# Patient Record
Sex: Male | Born: 1950 | Race: White | Hispanic: No | Marital: Married | Smoking: Never smoker
Health system: Southern US, Community
[De-identification: ages and names within clinical notes are randomized; demographics above are authoritative.]

## PROBLEM LIST (undated history)

## (undated) DIAGNOSIS — R519 Headache, unspecified: Secondary | ICD-10-CM

## (undated) DIAGNOSIS — R51 Headache: Secondary | ICD-10-CM

## (undated) DIAGNOSIS — G44099 Other trigeminal autonomic cephalgias (TAC), not intractable: Secondary | ICD-10-CM

## (undated) DIAGNOSIS — E785 Hyperlipidemia, unspecified: Secondary | ICD-10-CM

## (undated) DIAGNOSIS — K589 Irritable bowel syndrome without diarrhea: Secondary | ICD-10-CM

## (undated) HISTORY — PX: BACK SURGERY: SHX140

## (undated) HISTORY — PX: KNEE ARTHROCENTESIS: SUR44

## (undated) NOTE — Progress Notes (Signed)
 Formatting of this note might be different from the original. Subjective  Patient ID: Matthew Sheppard is a 51 y.o. male presenting to the Urgent Care with a chief complaint of Rash (Skin redness- burning/itchy to both hand x5days ).  Skin on the backs of both hands has been burning and itching for the past 5 or 6 days, especially when he wakes up in the morning and after washing his hands. The area is red and dry looking. No swelling, drainage, joint pain, fever, chills, nausea. 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 He denies any known exposure to poisonous plants or chemicals. He has started using a moisturizing cream in the last 2 days which seems to be helping. No new medications or medication changes.  Rash  Objective  BP 149/81 (BP Location: Left arm, Patient Position: Sitting, BP Cuff Size: Adult)   Pulse 92   Temp 36.4 C (97.6 F) (Temporal)   Resp 18   Ht 1.753 m (5' 9)   Wt 74.8 kg (165 lb)   SpO2 98%   BMI 24.37 kg/m   Physical Exam Vitals and nursing note reviewed.  Constitutional:      General: He is not in acute distress. Eyes:     Extraocular Movements: Extraocular movements intact.     Conjunctiva/sclera: Conjunctivae normal.  Cardiovascular:     Rate and Rhythm: Normal rate.  Pulmonary:     Effort: Pulmonary effort is normal.  Musculoskeletal:        General: Normal range of motion.     Right hand: No swelling, deformity, tenderness or bony tenderness. Normal range of motion. Normal strength. Normal sensation. Normal capillary refill. Normal pulse.     Left hand: No swelling, deformity, tenderness or bony tenderness. Normal range of motion. Normal strength. Normal sensation. Normal capillary refill. Normal pulse.  Skin:    Findings: Erythema (mild redness to the backs of both hands; no edema or induration, no vesicles or scale) present. No ecchymosis, rash or wound.  Neurological:     Mental Status: He is alert.     Sensory:  Sensation is intact.     Motor: Motor function is intact.     Coordination: Coordination is intact.     Gait: Gait is intact.     Assessment & Plan   Assessment & Plan Irritant dermatitis    Other orders   triamcinolone (Kenalog) 0.5 % ointment; Apply 1 Application topically in the morning and 1 Application before bedtime. Do all this for 7 days.  In-House Lab Results:  No results found for this or any previous visit.   In-House Imaging Reads:    Procedure Documentation: Procedures   ED Course & MDM  MDM - Medical Decision Making: Differential diagnosis considered include: dermatitis, irritant or contact; dry skin  Electronically signed by Lonni DELENA Potters, MD at 08/16/2024  5:47 PM EDT

---

## 2007-05-21 DIAGNOSIS — L570 Actinic keratosis: Secondary | ICD-10-CM

## 2007-05-21 HISTORY — DX: Actinic keratosis: L57.0

## 2010-09-12 ENCOUNTER — Ambulatory Visit: Payer: Self-pay | Admitting: Family Medicine

## 2011-07-11 ENCOUNTER — Ambulatory Visit: Payer: Self-pay | Admitting: Family Medicine

## 2012-04-20 ENCOUNTER — Ambulatory Visit: Payer: Self-pay | Admitting: Family Medicine

## 2015-06-28 ENCOUNTER — Ambulatory Visit
Admission: RE | Admit: 2015-06-28 | Discharge: 2015-06-28 | Disposition: A | Discharge status: Home / Self Care | Payer: BLUE CROSS/BLUE SHIELD | Source: Ambulatory Visit | Attending: Gastroenterology | Admitting: Gastroenterology

## 2015-06-28 ENCOUNTER — Encounter
Admission: RE | Disposition: A | Discharge status: Home / Self Care | Payer: Self-pay | Source: Ambulatory Visit | Attending: Gastroenterology

## 2015-06-28 ENCOUNTER — Encounter: Payer: Self-pay | Admitting: *Deleted

## 2015-06-28 ENCOUNTER — Ambulatory Visit: Payer: BLUE CROSS/BLUE SHIELD | Admitting: Anesthesiology

## 2015-06-28 DIAGNOSIS — R51 Headache: Secondary | ICD-10-CM | POA: Diagnosis not present

## 2015-06-28 DIAGNOSIS — Z79899 Other long term (current) drug therapy: Secondary | ICD-10-CM | POA: Insufficient documentation

## 2015-06-28 DIAGNOSIS — E785 Hyperlipidemia, unspecified: Secondary | ICD-10-CM | POA: Insufficient documentation

## 2015-06-28 DIAGNOSIS — K621 Rectal polyp: Secondary | ICD-10-CM | POA: Diagnosis not present

## 2015-06-28 DIAGNOSIS — K589 Irritable bowel syndrome without diarrhea: Secondary | ICD-10-CM | POA: Diagnosis not present

## 2015-06-28 DIAGNOSIS — Z1211 Encounter for screening for malignant neoplasm of colon: Secondary | ICD-10-CM | POA: Insufficient documentation

## 2015-06-28 DIAGNOSIS — Z7982 Long term (current) use of aspirin: Secondary | ICD-10-CM | POA: Insufficient documentation

## 2015-06-28 HISTORY — PX: COLONOSCOPY WITH PROPOFOL: SHX5780

## 2015-06-28 HISTORY — DX: Headache: R51

## 2015-06-28 HISTORY — DX: Headache, unspecified: R51.9

## 2015-06-28 HISTORY — DX: Irritable bowel syndrome, unspecified: K58.9

## 2015-06-28 HISTORY — DX: Hyperlipidemia, unspecified: E78.5

## 2015-06-28 SURGERY — COLONOSCOPY WITH PROPOFOL
Anesthesia: General

## 2015-06-28 MED ORDER — LIDOCAINE HCL (PF) 2 % IJ SOLN
INTRAMUSCULAR | Status: DC | PRN
  Administered 2015-06-28: 20 mg via INTRADERMAL

## 2015-06-28 MED ORDER — PROPOFOL INFUSION 10 MG/ML OPTIME
INTRAVENOUS | Status: DC | PRN
  Administered 2015-06-28: 125 ug/kg/min via INTRAVENOUS

## 2015-06-28 MED ORDER — SODIUM CHLORIDE 0.9 % IV SOLN: INTRAVENOUS | Status: DC

## 2015-06-28 MED ORDER — PROPOFOL 10 MG/ML IV BOLUS
INTRAVENOUS | Status: DC | PRN
  Administered 2015-06-28: 80 mg via INTRAVENOUS

## 2015-06-28 MED ORDER — SODIUM CHLORIDE 0.9 % IV SOLN
INTRAVENOUS | Status: DC
Start: 2015-06-28 — End: 2015-06-28
  Administered 2015-06-28: 1000 mL via INTRAVENOUS

## 2015-06-28 NOTE — H&P (Signed)
Outpatient short stay form Pre-procedure 06/28/2015 9:11 AM Lollie Sails MD  Primary Physician: Dr. Thereasa Distance  Reason for visit:  Colonoscopy  History of present illness:  Patient is a 64 year old male presenting with a personal history of irritable bowel syndrome and for screening colonoscopy. His prep well. He takes no over-the-counter or aspirin products or blood thinners with the exception 81 mg aspirin which he is held for at least several days.    Current facility-administered medications:  .  0.9 %  sodium chloride infusion, , Intravenous, Continuous, Lollie Sails, MD .  0.9 %  sodium chloride infusion, , Intravenous, Continuous, Lollie Sails, MD, Last Rate: 20 mL/hr at 06/28/15 0838, 1,000 mL at 06/28/15 8299  Prescriptions prior to admission  Medication Sig Dispense Refill Last Dose  . aspirin EC 81 MG tablet Take 81 mg by mouth daily.     . carbamazepine (TEGRETOL) 200 MG tablet Take 200 mg by mouth 3 (three) times daily.     Marland Kitchen doxycycline (MONODOX) 50 MG capsule Take 50 mg by mouth 2 (two) times daily.     . hyoscyamine (ANASPAZ) 0.125 MG TBDP disintergrating tablet Place 0.125 mg under the tongue.     . indomethacin (INDOCIN) 50 MG capsule Take 50 mg by mouth 2 (two) times daily with a meal.     . pravastatin (PRAVACHOL) 10 MG tablet Take 10 mg by mouth daily.        No Known Allergies   Past Medical History  Diagnosis Date  . IBS (irritable bowel syndrome)   . Headache   . Hyperlipemia     Review of systems:      Physical Exam    Heart and lungs: Regular rate and rhythm without rub or gallop, lungs are bilaterally clear    HEENT: Normocephalic atraumatic eyes are anicteric    Other:     Pertinant exam for procedure: Soft nontender nondistended bowel sounds positive normoactive    Planned proceedures: Colonoscopy and indicated procedures I have discussed the risks benefits and complications of procedures to include not limited to  bleeding, infection, perforation and the risk of sedation and the patient wishes to proceed.    Lollie Sails, MD Gastroenterology 06/28/2015  9:11 AM

## 2015-06-28 NOTE — Anesthesia Preprocedure Evaluation (Signed)
Anesthesia Evaluation  Patient identified by MRN, date of birth, ID band Patient awake    Reviewed: Allergy & Precautions, H&P , NPO status , Patient's Chart, lab work & pertinent test results, reviewed documented beta blocker date and time   History of Anesthesia Complications Negative for: history of anesthetic complications  Airway Mallampati: II  TM Distance: >3 FB Neck ROM: full    Dental no notable dental hx. (+) Teeth Intact   Pulmonary neg pulmonary ROS,  breath sounds clear to auscultation  Pulmonary exam normal       Cardiovascular Exercise Tolerance: Good negative cardio ROS Normal cardiovascular examRhythm:regular Rate:Normal     Neuro/Psych  Headaches, negative psych ROS   GI/Hepatic negative GI ROS, Neg liver ROS,   Endo/Other  negative endocrine ROS  Renal/GU negative Renal ROS  negative genitourinary   Musculoskeletal   Abdominal   Peds  Hematology negative hematology ROS (+)   Anesthesia Other Findings Past Medical History:   IBS (irritable bowel syndrome)                               Headache                                                     Hyperlipemia                                                 Reproductive/Obstetrics negative OB ROS                             Anesthesia Physical Anesthesia Plan  ASA: II  Anesthesia Plan: General   Post-op Pain Management:    Induction:   Airway Management Planned:   Additional Equipment:   Intra-op Plan:   Post-operative Plan:   Informed Consent: I have reviewed the patients History and Physical, chart, labs and discussed the procedure including the risks, benefits and alternatives for the proposed anesthesia with the patient or authorized representative who has indicated his/her understanding and acceptance.   Dental Advisory Given  Plan Discussed with: Anesthesiologist, CRNA and Surgeon  Anesthesia Plan  Comments:         Anesthesia Quick Evaluation

## 2015-06-28 NOTE — Transfer of Care (Signed)
Immediate Anesthesia Transfer of Care Note  Patient: Matthew Sheppard  Procedure(s) Performed: Procedure(s): COLONOSCOPY WITH PROPOFOL (N/A)  Patient Location: Endoscopy Unit  Anesthesia Type:General  Level of Consciousness: awake, alert  and oriented  Airway & Oxygen Therapy: Patient Spontanous Breathing and Patient connected to nasal cannula oxygen  Post-op Assessment: Report given to RN and Post -op Vital signs reviewed and stable  Post vital signs: Reviewed and stable  Last Vitals:  Filed Vitals:   06/28/15 0819  BP: 126/82  Pulse: 86  Temp: 36.7 C  Resp: 18    Complications: No apparent anesthesia complications

## 2015-06-29 ENCOUNTER — Encounter: Payer: Self-pay | Admitting: Gastroenterology

## 2015-06-30 LAB — SURGICAL PATHOLOGY

## 2015-06-30 NOTE — Anesthesia Postprocedure Evaluation (Signed)
  Anesthesia Post-op Note  Patient: Matthew Sheppard  Procedure(s) Performed: Procedure(s): COLONOSCOPY WITH PROPOFOL (N/A)  Anesthesia type:General  Patient location: PACU  Post pain: Pain level controlled  Post assessment: Post-op Vital signs reviewed, Patient's Cardiovascular Status Stable, Respiratory Function Stable, Patent Airway and No signs of Nausea or vomiting  Post vital signs: Reviewed and stable  Last Vitals:  Filed Vitals:   06/28/15 1020  BP: 103/79  Pulse: 65  Temp:   Resp: 12    Level of consciousness: awake, alert  and patient cooperative  Complications: No apparent anesthesia complications

## 2015-06-30 NOTE — Op Note (Signed)
[facility] Gastroenterology Patient Name: Matthew Sheppard Procedure Date: 06/28/2015 9:15 AM MRN: [id number] Account #: [id number] Date of Birth: April 11, 1951 Admit Type: Outpatient Age: 63 Room: [facility] ENDO ROOM 3 Gender: Male Note Status: Finalized Procedure:         Colonoscopy Indications:       Screening for colorectal malignant neoplasm Providers:         Lollie Sails, MD Referring MD:      Sofie Hartigan (Referring MD) Medicines:         Monitored Anesthesia Care Complications:     No immediate complications. Procedure:         Pre-Anesthesia Assessment:                    - ASA Grade Assessment: II - A patient with mild systemic                     disease.                    After obtaining informed consent, the colonoscope was                     passed under direct vision. Throughout the procedure, the                     patient's blood pressure, pulse, and oxygen saturations                     were monitored continuously. The Colonoscope was                     introduced through the anus and advanced to the the cecum,                     identified by appendiceal orifice and ileocecal valve. The                     colonoscopy was performed without difficulty. The patient                     tolerated the procedure well. The quality of the bowel                     preparation was good except the ascending colon was fair. Findings:      A 4 mm polyp was found in the rectum. The polyp was sessile. The polyp       was removed with a cold snare. Resection and retrieval were complete.      The exam was otherwise without abnormality.      The retroflexed view of the distal rectum and anal verge was normal and       showed no anal or rectal abnormalities. Impression:        - One 4 mm polyp in the rectum. Resected and retrieved. Recommendation:    - Await pathology results.                    - Telephone GI clinic for pathology results in 1  week. Procedure Code(s): --- Professional ---                    [phone], Colonoscopy, flexible; with removal of tumor(s),  polyp(s), or other lesion(s) by snare technique Diagnosis Code(s): --- Professional ---                    V76.51, Special screening for malignant neoplasms of colon                    569.0, Anal and rectal polyp CPT copyright 2014 American Medical Association. All rights reserved. The codes documented in this report are preliminary and upon coder review may  be revised to meet current compliance requirements. Lollie Sails, MD 06/28/2015 9:47:20 AM This report has been signed electronically. Number of Addenda: 0 Note Initiated On: 06/28/2015 9:15 AM Scope Withdrawal Time: 0 hours 10 minutes 26 seconds  Total Procedure Duration: 0 hours 19 minutes 42 seconds       [facility]

## 2016-03-22 ENCOUNTER — Other Ambulatory Visit: Payer: Self-pay | Admitting: Gastroenterology

## 2016-03-22 DIAGNOSIS — R1031 Right lower quadrant pain: Secondary | ICD-10-CM

## 2016-03-22 DIAGNOSIS — R1032 Left lower quadrant pain: Secondary | ICD-10-CM

## 2016-03-27 ENCOUNTER — Ambulatory Visit
Admission: RE | Admit: 2016-03-27 | Discharge: 2016-03-27 | Disposition: A | Discharge status: Home / Self Care | Payer: BLUE CROSS/BLUE SHIELD | Source: Ambulatory Visit | Attending: Gastroenterology | Admitting: Gastroenterology

## 2016-03-27 DIAGNOSIS — R1031 Right lower quadrant pain: Secondary | ICD-10-CM | POA: Insufficient documentation

## 2016-03-27 DIAGNOSIS — R911 Solitary pulmonary nodule: Secondary | ICD-10-CM | POA: Diagnosis not present

## 2016-03-27 DIAGNOSIS — R1032 Left lower quadrant pain: Secondary | ICD-10-CM | POA: Insufficient documentation

## 2016-03-27 MED ORDER — IOPAMIDOL (ISOVUE-300) INJECTION 61%
100.0000 mL | Freq: Once | INTRAVENOUS | Status: AC | PRN
  Administered 2016-03-27: 100 mL via INTRAVENOUS

## 2017-05-23 ENCOUNTER — Other Ambulatory Visit: Payer: Self-pay | Admitting: Family Medicine

## 2017-05-23 DIAGNOSIS — R911 Solitary pulmonary nodule: Secondary | ICD-10-CM

## 2017-07-18 ENCOUNTER — Ambulatory Visit
Admission: RE | Admit: 2017-07-18 | Discharge: 2017-07-18 | Disposition: A | Discharge status: Home / Self Care | Payer: BLUE CROSS/BLUE SHIELD | Source: Ambulatory Visit | Attending: Family Medicine | Admitting: Family Medicine

## 2017-07-18 DIAGNOSIS — R911 Solitary pulmonary nodule: Secondary | ICD-10-CM

## 2020-02-02 ENCOUNTER — Encounter: Payer: BLUE CROSS/BLUE SHIELD | Admitting: Dermatology

## 2020-06-22 ENCOUNTER — Other Ambulatory Visit: Payer: Self-pay

## 2020-06-22 ENCOUNTER — Ambulatory Visit: Payer: BC Managed Care – PPO | Admitting: Dermatology

## 2020-06-22 DIAGNOSIS — D229 Melanocytic nevi, unspecified: Secondary | ICD-10-CM

## 2020-06-22 DIAGNOSIS — L814 Other melanin hyperpigmentation: Secondary | ICD-10-CM

## 2020-06-22 DIAGNOSIS — L820 Inflamed seborrheic keratosis: Secondary | ICD-10-CM | POA: Diagnosis not present

## 2020-06-22 DIAGNOSIS — L821 Other seborrheic keratosis: Secondary | ICD-10-CM

## 2020-06-22 DIAGNOSIS — Z1283 Encounter for screening for malignant neoplasm of skin: Secondary | ICD-10-CM

## 2020-06-22 DIAGNOSIS — L720 Epidermal cyst: Secondary | ICD-10-CM | POA: Diagnosis not present

## 2020-06-22 DIAGNOSIS — L719 Rosacea, unspecified: Secondary | ICD-10-CM | POA: Diagnosis not present

## 2020-06-22 DIAGNOSIS — D1800 Hemangioma unspecified site: Secondary | ICD-10-CM

## 2020-06-22 DIAGNOSIS — L570 Actinic keratosis: Secondary | ICD-10-CM | POA: Diagnosis not present

## 2020-06-22 DIAGNOSIS — L578 Other skin changes due to chronic exposure to nonionizing radiation: Secondary | ICD-10-CM

## 2020-06-22 MED ORDER — DOXYCYCLINE MONOHYDRATE 50 MG PO CAPS: 50.0000 mg | ORAL_CAPSULE | Freq: Every day | ORAL | 6 refills | Status: DC

## 2020-06-22 NOTE — Progress Notes (Signed)
   Follow-Up Visit   Subjective  Matthew Sheppard is a 69 y.o. male who presents for the following: Annual Exam (History of AK - UBSE today). The patient presents for Upper Body Skin Exam (UBSE) for skin cancer screening and mole check.  The following portions of the chart were reviewed this encounter and updated as appropriate:  Allergies  Meds  Problems  Med Hx  Surg Hx  Fam Hx     Review of Systems:  No other skin or systemic complaints except as noted in HPI or Assessment and Plan.  Objective  Well appearing patient in no apparent distress; mood and affect are within normal limits.  All skin waist up examined.  Objective  Left mid back, Neck - Posterior: 1.1 cm cystic papule of left mid back  1.0 cm cystic papule of left post neck  Objective  Face: Dilated blood vessels and thickening of skin of nose   Objective  Scalp/left ear (3): Erythematous thin papules/macules with gritty scale.   Objective  Right lat forehead: Erythematous keratotic or waxy stuck-on papule or plaque.    Assessment & Plan    Lentigines - Scattered tan macules - Discussed due to sun exposure - Benign, observe - Call for any changes  Seborrheic Keratoses - Stuck-on, waxy, tan-brown papules and plaques  - Discussed benign etiology and prognosis. - Observe - Call for any changes  Melanocytic Nevi - Tan-brown and/or pink-flesh-colored symmetric macules and papules - Benign appearing on exam today - Observation - Call clinic for new or changing moles - Recommend daily use of broad spectrum spf 30+ sunscreen to sun-exposed areas.   Hemangiomas - Red papules - Discussed benign nature - Observe - Call for any changes  Actinic Damage - diffuse scaly erythematous macules with underlying dyspigmentation - Recommend daily broad spectrum sunscreen SPF 30+ to sun-exposed areas, reapply every 2 hours as needed.  - Call for new or changing lesions.  Skin cancer screening performed  today.  Epidermal inclusion cyst (2) Neck - Posterior; Left mid back  Discussed excision. Patient may consider in the future  Rosacea Face  Rosacea with rhinophyma  doxycycline (MONODOX) 50 MG capsule - Face  AK (actinic keratosis) (3) Scalp/left ear  Destruction of lesion - Scalp/left ear Complexity: simple   Destruction method: cryotherapy   Informed consent: discussed and consent obtained   Timeout:  patient name, date of birth, surgical site, and procedure verified Lesion destroyed using liquid nitrogen: Yes   Region frozen until ice ball extended beyond lesion: Yes   Outcome: patient tolerated procedure well with no complications   Post-procedure details: wound care instructions given    Inflamed seborrheic keratosis Right lat forehead  Destruction of lesion - Right lat forehead Complexity: simple   Destruction method: cryotherapy   Informed consent: discussed and consent obtained   Timeout:  patient name, date of birth, surgical site, and procedure verified Lesion destroyed using liquid nitrogen: Yes   Region frozen until ice ball extended beyond lesion: Yes   Outcome: patient tolerated procedure well with no complications   Post-procedure details: wound care instructions given    Skin cancer screening  Return in about 6 months (around 12/23/2020).   I, Ashok Cordia, CMA, am acting as scribe for Sarina Ser, MD .  Documentation: I have reviewed the above documentation for accuracy and completeness, and I agree with the above.  Sarina Ser, MD

## 2020-06-23 ENCOUNTER — Encounter: Payer: No Typology Code available for payment source | Admitting: Dermatology

## 2020-06-28 ENCOUNTER — Encounter: Payer: Self-pay | Admitting: Dermatology

## 2020-07-13 ENCOUNTER — Other Ambulatory Visit: Payer: Self-pay | Admitting: Dermatology

## 2020-08-16 ENCOUNTER — Ambulatory Visit
Admission: EM | Admit: 2020-08-16 | Discharge: 2020-08-16 | Disposition: A | Discharge status: Home / Self Care | Payer: BC Managed Care – PPO

## 2020-08-16 ENCOUNTER — Other Ambulatory Visit: Payer: Self-pay

## 2020-08-16 ENCOUNTER — Encounter: Payer: Self-pay | Admitting: Emergency Medicine

## 2020-08-16 ENCOUNTER — Ambulatory Visit (INDEPENDENT_AMBULATORY_CARE_PROVIDER_SITE_OTHER): Payer: BC Managed Care – PPO

## 2020-08-16 DIAGNOSIS — S6710XA Crushing injury of unspecified finger(s), initial encounter: Secondary | ICD-10-CM

## 2020-08-16 DIAGNOSIS — L539 Erythematous condition, unspecified: Secondary | ICD-10-CM

## 2020-08-16 DIAGNOSIS — M7989 Other specified soft tissue disorders: Secondary | ICD-10-CM

## 2020-08-16 DIAGNOSIS — S6010XA Contusion of unspecified finger with damage to nail, initial encounter: Secondary | ICD-10-CM

## 2020-08-16 DIAGNOSIS — M79645 Pain in left finger(s): Secondary | ICD-10-CM

## 2020-08-16 NOTE — ED Triage Notes (Signed)
Pt c/o left ring finger pain, swelling and bruising. He states he using a crow bar and it slipped and hit his finger at the nail. Injury occurred about 3 days ago.

## 2020-08-16 NOTE — ED Provider Notes (Signed)
[facility]    CSN: [id number] Arrival date & time: 08/16/20  1209      History   Chief Complaint Chief Complaint  Patient presents with   Finger Injury    left ring finger    HPI Matthew Sheppard is a 69 y.o. male   presenting for injury of the left ring finger 3 days ago.  He says that he has bruising under the nail and pain at the tip of the finger.  Patient states he initially injured this finger a week and half ago when he hit with a wrench.  He says it seemed fine until he reinjured the same finger 3 days ago when he was using a wood splitter.  He denies any cuts admits to swelling, bruising and pain.  He has not been able to relieve the pain.  He denies any associated fevers.  There is no bleeding or drainage.  He does have pain moving the finger especially moving the tip of the finger.  Never had any problems with his finger before.  No other complaints or concerns.  HPI  Past Medical History:  Diagnosis Date   Actinic keratosis 05/21/2007   Right cheek.    Actinic keratosis 06/24/2014   Mid to distal dorsum nose.    Headache    Hyperlipemia    IBS (irritable bowel syndrome)     There are no problems to display for this patient.   Past Surgical History:  Procedure Laterality Date   BACK SURGERY     COLONOSCOPY WITH PROPOFOL N/A 06/28/2015   Procedure: COLONOSCOPY WITH PROPOFOL;  Surgeon: Lollie Sails, MD;  Location: [facility] ENDOSCOPY;  Service: Endoscopy;  Laterality: N/A;   KNEE ARTHROCENTESIS         Home Medications    Prior to Admission medications   Medication Sig Start Date End Date Taking? Authorizing Provider  aspirin EC 81 MG tablet Take 81 mg by mouth daily.   Yes [provider]  carbamazepine (TEGRETOL) 200 MG tablet Take 200 mg by mouth 3 (three) times daily.   Yes [provider]  gabapentin (NEURONTIN) 800 MG tablet TAKE 1 TABLET(800 MG) BY MOUTH THREE TIMES DAILY 06/16/20  Yes [provider]   indomethacin (INDOCIN) 50 MG capsule Take 50 mg by mouth 2 (two) times daily with a meal.   Yes [provider]  pravastatin (PRAVACHOL) 10 MG tablet Take 10 mg by mouth daily.   Yes [provider]  doxycycline (ADOXA) 50 MG tablet TAKE 1 TABLET BY MOUTH ONCE A DAY WITH FOOD AND PLENTY OF FLUID 07/13/20   Ralene Bathe, MD  doxycycline (MONODOX) 50 MG capsule Take 1 capsule (50 mg total) by mouth daily. 06/22/20   Ralene Bathe, MD  hyoscyamine (ANASPAZ) 0.125 MG TBDP disintergrating tablet Place 0.125 mg under the tongue.    [provider]    Family History History reviewed. No pertinent family history.  Social History Social History   Tobacco Use   Smoking status: Never Smoker   Smokeless tobacco: Never Used  Scientific laboratory technician Use: Never used  Substance Use Topics   Alcohol use: Not Currently   Drug use: Not Currently     Allergies   Patient has no known allergies.   Review of Systems Review of Systems  Constitutional: Negative for fatigue.  Musculoskeletal: Positive for arthralgias and joint swelling.  Skin: Positive for color change. Negative for rash and wound.  Neurological: Negative  for weakness and numbness.  Hematological: Does not bruise/bleed easily.     Physical Exam Triage Vital Signs ED Triage Vitals  Enc Vitals Group     BP 08/16/20 1259 138/85     Pulse Rate 08/16/20 1259 92     Resp 08/16/20 1259 18     Temp 08/16/20 1259 98.4 F (36.9 C)     Temp Source 08/16/20 1259 Oral     SpO2 08/16/20 1259 97 %     Weight 08/16/20 1255 162 lb 0.6 oz (73.5 kg)     Height 08/16/20 1255 5\' 9"  (1.753 m)     Head Circumference --      Peak Flow --      Pain Score 08/16/20 1255 4     Pain Loc --      Pain Edu? --      Excl. in [place]? --    No data found.  Updated Vital Signs BP 138/85 (BP Location: Left Arm)    Pulse 92    Temp 98.4 F (36.9 C) (Oral)    Resp 18    Ht 5\' 9"  (1.753 m)    Wt 162 lb 0.6 oz (73.5 kg)     SpO2 97%    BMI 23.93 kg/m       Physical Exam Vitals and nursing note reviewed.  Constitutional:      General: He is not in acute distress.    Appearance: Normal appearance. He is well-developed and normal weight. He is not toxic-appearing.  HENT:     Head: Normocephalic and atraumatic.  Eyes:     General: No scleral icterus.    Conjunctiva/sclera: Conjunctivae normal.  Cardiovascular:     Rate and Rhythm: Normal rate and regular rhythm.     Pulses: Normal pulses.  Pulmonary:     Effort: Pulmonary effort is normal. No respiratory distress.  Musculoskeletal:     Left hand: Swelling (there is moderate/severe swelling of the distal finger with associated erythema and warmth), tenderness (TTP also around the cuticle and there is also subungual hematoma with dried blood seen under nail) and bony tenderness (DIP) present. No lacerations. Decreased range of motion (at DIP). Normal strength. Normal sensation. Normal pulse.     Cervical back: Neck supple.  Skin:    General: Skin is warm and dry.  Neurological:     General: No focal deficit present.     Mental Status: He is alert. Mental status is at baseline.     Motor: No weakness.     Gait: Gait normal.  Psychiatric:        Mood and Affect: Mood normal.        Behavior: Behavior normal.        Thought Content: Thought content normal.      UC Treatments / Results  Labs (all labs ordered are listed, but only abnormal results are displayed) Labs Reviewed - No data to display  EKG   Radiology DG Finger Ring Left  Result Date: 08/16/2020 CLINICAL DATA:  Pt c/o left ring finger pain, swelling and bruising. EXAM: LEFT RING FINGER 2+V COMPARISON:  None. FINDINGS: There is no evidence of fracture or dislocation. There is no evidence of arthropathy or other focal bone abnormality. Punctate density projecting at the nail bed IMPRESSION: 1.  There is no acute osseous abnormality in the left ring finger. 2. Punctate density projecting  at the nail bed of uncertain etiology, possibly foreign material. Electronically Signed   By: Izora Gala  Dimas Aguas M.D.   On: 08/16/2020 13:32    Procedures Procedures (including critical care time)  Medications Ordered in UC Medications - No data to display  Initial Impression / Assessment and Plan / UC Course  I have reviewed the triage vital signs and the nursing notes.  Pertinent labs & imaging results that were available during my care of the patient were reviewed by me and considered in my medical decision making (see chart for details).    Referred to [facility] for immediate follow up with specialist since they have hand surgeons at clinic and unsure of cause--infection (felon) vs hematoma and abnormal imaging. Imaging questions possible FB. Patient says he was wearing gloves when using the wood splitter and denies possibility of FB.  Agreeable to proceed to [facility] at this time for evaluation.  Final Clinical Impressions(s) / UC Diagnoses   Final diagnoses:  Subungual hematoma of digit of hand, initial encounter  Crushing injury of finger of left hand  Swelling of left ring finger  Erythematous condition     Discharge Instructions     Referred to [facility] for immediate follow up with specialist since they have hand surgeons at clinic and unsure of cause--infection vs hematoma and abnormal imaging    ED Prescriptions    None     PDMP not reviewed this encounter.   Danton Clap, PA-C 08/17/20 1013

## 2020-08-16 NOTE — Discharge Instructions (Addendum)
Referred to [facility] for immediate follow up with specialist since they have hand surgeons at clinic and unsure of cause--infection vs hematoma and abnormal imaging

## 2021-01-02 ENCOUNTER — Ambulatory Visit: Payer: BC Managed Care – PPO | Admitting: Dermatology

## 2021-02-28 ENCOUNTER — Other Ambulatory Visit: Payer: Self-pay

## 2021-02-28 ENCOUNTER — Ambulatory Visit: Payer: BC Managed Care – PPO | Admitting: Dermatology

## 2021-02-28 DIAGNOSIS — L821 Other seborrheic keratosis: Secondary | ICD-10-CM

## 2021-02-28 DIAGNOSIS — Z872 Personal history of diseases of the skin and subcutaneous tissue: Secondary | ICD-10-CM | POA: Diagnosis not present

## 2021-02-28 DIAGNOSIS — Z1283 Encounter for screening for malignant neoplasm of skin: Secondary | ICD-10-CM

## 2021-02-28 DIAGNOSIS — L570 Actinic keratosis: Secondary | ICD-10-CM

## 2021-02-28 DIAGNOSIS — D1800 Hemangioma unspecified site: Secondary | ICD-10-CM

## 2021-02-28 DIAGNOSIS — L603 Nail dystrophy: Secondary | ICD-10-CM | POA: Diagnosis not present

## 2021-02-28 DIAGNOSIS — L814 Other melanin hyperpigmentation: Secondary | ICD-10-CM

## 2021-02-28 DIAGNOSIS — L720 Epidermal cyst: Secondary | ICD-10-CM | POA: Diagnosis not present

## 2021-02-28 DIAGNOSIS — L578 Other skin changes due to chronic exposure to nonionizing radiation: Secondary | ICD-10-CM

## 2021-02-28 DIAGNOSIS — D229 Melanocytic nevi, unspecified: Secondary | ICD-10-CM

## 2021-02-28 MED ORDER — TOBRAMYCIN 0.3 % OP SOLN: 1.0000 [drp] | Freq: Every day | OPHTHALMIC | 1 refills | Status: DC

## 2021-02-28 MED ORDER — TOBRAMYCIN 0.3 % OP SOLN: OPHTHALMIC | 1 refills | Status: DC

## 2021-02-28 NOTE — Progress Notes (Signed)
Follow-Up Visit   Subjective  Matthew Sheppard is a 70 y.o. male who presents for the following: Upper body skin exam (Hx of AKs) and check fingernails (L hand, hx of trauma and infection and then started on another nail). The patient presents for Upper Body Skin Exam (UBSE) for skin cancer screening and mole check.  The following portions of the chart were reviewed this encounter and updated as appropriate:   Tobacco  Allergies  Meds  Problems  Med Hx  Surg Hx  Fam Hx     Review of Systems:  No other skin or systemic complaints except as noted in HPI or Assessment and Plan.  Objective  Well appearing patient in no apparent distress; mood and affect are within normal limits.  All skin waist up examined.  Objective  L mid back, L post neck: 1.1cm L mid back cystic pap 1.0cm L post neck cystic pap  Objective  Scalp x 3 (3): Pink scaly macules   Objective  L index fingernail, L 4th fingernail: Nail dystrophy  Images       Assessment & Plan    Lentigines - Scattered tan macules - Due to sun exposure - Benign-appering, observe - Recommend daily broad spectrum sunscreen SPF 30+ to sun-exposed areas, reapply every 2 hours as needed. - Call for any changes  Seborrheic Keratoses - Stuck-on, waxy, tan-brown papules and/or plaques  - Benign-appearing - Discussed benign etiology and prognosis. - Observe - Call for any changes  Melanocytic Nevi - Tan-brown and/or pink-flesh-colored symmetric macules and papules - Benign appearing on exam today - Observation - Call clinic for new or changing moles - Recommend daily use of broad spectrum spf 30+ sunscreen to sun-exposed areas.   Hemangiomas - Red papules - Discussed benign nature - Observe - Call for any changes  Actinic Damage - Chronic condition, secondary to cumulative UV/sun exposure - diffuse scaly erythematous macules with underlying dyspigmentation - Recommend daily broad spectrum sunscreen SPF  30+ to sun-exposed areas, reapply every 2 hours as needed.  - Staying in the shade or wearing long sleeves, sun glasses (UVA+UVB protection) and wide brim hats (4-inch brim around the entire circumference of the hat) are also recommended for sun protection.  - Call for new or changing lesions.  Skin cancer screening performed today.  Epidermal cyst L mid back, L post neck Benign-appearing.  Observation.  Call clinic for new or changing moles.  Recommend daily use of broad spectrum spf 30+ sunscreen to sun-exposed areas.    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   I, Othelia Pulling, RMA, am acting as scribe for Sarina Ser, MD .  Documentation: I have reviewed the above documentation for accuracy and completeness, and I agree with the above.  Sarina Ser, MD

## 2021-02-28 NOTE — Progress Notes (Signed)
RX clarification for Tobramycin to [facility].

## 2021-02-28 NOTE — Patient Instructions (Signed)

## 2021-03-02 ENCOUNTER — Encounter: Payer: Self-pay | Admitting: Dermatology

## 2021-03-17 ENCOUNTER — Other Ambulatory Visit: Payer: Self-pay | Admitting: Dermatology

## 2021-03-17 DIAGNOSIS — L719 Rosacea, unspecified: Secondary | ICD-10-CM

## 2021-05-19 ENCOUNTER — Other Ambulatory Visit: Payer: Self-pay | Admitting: Dermatology

## 2021-05-25 ENCOUNTER — Encounter: Payer: Self-pay | Admitting: Emergency Medicine

## 2021-05-25 ENCOUNTER — Ambulatory Visit
Admission: EM | Admit: 2021-05-25 | Discharge: 2021-05-25 | Disposition: A | Discharge status: Home / Self Care | Payer: BC Managed Care – PPO | Attending: Emergency Medicine | Admitting: Emergency Medicine

## 2021-05-25 ENCOUNTER — Other Ambulatory Visit: Payer: Self-pay

## 2021-05-25 DIAGNOSIS — Z20822 Contact with and (suspected) exposure to covid-19: Secondary | ICD-10-CM | POA: Diagnosis not present

## 2021-05-25 DIAGNOSIS — H9201 Otalgia, right ear: Secondary | ICD-10-CM | POA: Diagnosis not present

## 2021-05-25 DIAGNOSIS — R0602 Shortness of breath: Secondary | ICD-10-CM | POA: Insufficient documentation

## 2021-05-25 DIAGNOSIS — J029 Acute pharyngitis, unspecified: Secondary | ICD-10-CM

## 2021-05-25 NOTE — ED Provider Notes (Signed)
[facility]    CSN: [id number] Arrival date & time: 05/25/21  1900      History   Chief Complaint Chief Complaint  Patient presents with   Sore Throat   Otalgia    Right     HPI Matthew Sheppard is a 70 y.o. male.   HPI  70 year old male here for evaluation of right-sided sore throat and right ear pain.  Patient reports that he has been experiencing the above symptoms for the past 5 days.  He states that it is painful for him to swallow and also painful for him to open his mouth fully.  He denies any fever, runny nose or nasal congestion, ringing in his ears, drainage from his ear, changes to his hearing, cough, shortness of breath, or GI complaints.  Past Medical History:  Diagnosis Date   Actinic keratosis 05/21/2007   Right cheek.    Actinic keratosis 06/24/2014   Mid to distal dorsum nose.    Headache    Hyperlipemia    IBS (irritable bowel syndrome)     There are no problems to display for this patient.   Past Surgical History:  Procedure Laterality Date   BACK SURGERY     COLONOSCOPY WITH PROPOFOL N/A 06/28/2015   Procedure: COLONOSCOPY WITH PROPOFOL;  Surgeon: Lollie Sails, MD;  Location: [facility] ENDOSCOPY;  Service: Endoscopy;  Laterality: N/A;   KNEE ARTHROCENTESIS         Home Medications    Prior to Admission medications   Medication Sig Start Date End Date Taking? Authorizing Provider  aspirin EC 81 MG tablet Take 81 mg by mouth daily.    [provider]  carbamazepine (TEGRETOL) 200 MG tablet Take 200 mg by mouth 3 (three) times daily.    [provider]  doxycycline (ADOXA) 50 MG tablet TAKE 1 TABLET BY MOUTH ONCE A DAY WITH FOOD AND PLENTY OF FLUID 07/13/20   Ralene Bathe, MD  doxycycline (MONODOX) 50 MG capsule TAKE 1 CAPSULE(50 MG) BY MOUTH DAILY 03/17/21   Ralene Bathe, MD  doxycycline (VIBRAMYCIN) 50 MG capsule TAKE 1 CAPSULE BY MOUTH ONCE DAILY 05/22/21   Ralene Bathe, MD  gabapentin (NEURONTIN)  800 MG tablet TAKE 1 TABLET(800 MG) BY MOUTH THREE TIMES DAILY 06/16/20   [provider]  hyoscyamine (ANASPAZ) 0.125 MG TBDP disintergrating tablet Place 0.125 mg under the tongue.    [provider]  indomethacin (INDOCIN) 50 MG capsule Take 50 mg by mouth 2 (two) times daily with a meal.    [provider]  pravastatin (PRAVACHOL) 10 MG tablet Take 10 mg by mouth daily.    [provider]  tobramycin (TOBREX) 0.3 % ophthalmic solution Apply to affected fingernails qhs 02/28/21   Ralene Bathe, MD    Family History History reviewed. No pertinent family history.  Social History Social History   Tobacco Use   Smoking status: Never   Smokeless tobacco: Never  Vaping Use   Vaping Use: Never used  Substance Use Topics   Alcohol use: Not Currently   Drug use: Not Currently     Allergies   Patient has no known allergies.   Review of Systems Review of Systems  Constitutional:  Negative for activity change, appetite change and fever.  HENT:  Positive for ear pain and sore throat. Negative for congestion, ear discharge and rhinorrhea.   Respiratory:  Negative for cough, shortness of breath and wheezing.   Psychiatric/Behavioral: Negative.  Physical Exam Triage Vital Signs ED Triage Vitals  Enc Vitals Group     BP 05/25/21 1916 140/80     Pulse Rate 05/25/21 1916 100     Resp 05/25/21 1916 18     Temp 05/25/21 1916 98.7 F (37.1 C)     Temp Source 05/25/21 1916 Oral     SpO2 05/25/21 1916 99 %     Weight --      Height --      Head Circumference --      Peak Flow --      Pain Score 05/25/21 1915 10     Pain Loc --      Pain Edu? --      Excl. in [place]? --    No data found.  Updated Vital Signs BP 140/80   Pulse 100   Temp 98.7 F (37.1 C) (Oral)   Resp 18   SpO2 99%   Visual Acuity Right Eye Distance:   Left Eye Distance:   Bilateral Distance:    Right Eye Near:   Left Eye Near:    Bilateral Near:     Physical  Exam Vitals and nursing note reviewed.  Constitutional:      Appearance: Normal appearance. He is normal weight.  HENT:     Head: Normocephalic and atraumatic.     Right Ear: Tympanic membrane, ear canal and external ear normal. There is no impacted cerumen.     Left Ear: Tympanic membrane, ear canal and external ear normal. There is no impacted cerumen.     Nose: Nose normal. No congestion or rhinorrhea.     Mouth/Throat:     Mouth: Mucous membranes are moist.     Pharynx: Posterior oropharyngeal erythema present.  Cardiovascular:     Rate and Rhythm: Normal rate and regular rhythm.     Pulses: Normal pulses.     Heart sounds: Normal heart sounds. No murmur heard.   No gallop.  Pulmonary:     Effort: Pulmonary effort is normal.     Breath sounds: Normal breath sounds. No wheezing, rhonchi or rales.  Musculoskeletal:     Cervical back: Normal range of motion and neck supple. Tenderness present.  Lymphadenopathy:     Cervical: No cervical adenopathy.  Skin:    General: Skin is warm and dry.     Capillary Refill: Capillary refill takes less than 2 seconds.     Findings: No erythema or rash.  Neurological:     General: No focal deficit present.     Mental Status: He is oriented to person, place, and time.  Psychiatric:        Mood and Affect: Mood normal.        Behavior: Behavior normal.        Thought Content: Thought content normal.        Judgment: Judgment normal.     UC Treatments / Results  Labs (all labs ordered are listed, but only abnormal results are displayed) Labs Reviewed  SARS CORONAVIRUS 2 (TAT 6-24 HRS)  CULTURE, GROUP A STREP [facility])  POCT RAPID STREP A, ED / UC    EKG   Radiology No results found.  Procedures Procedures (including critical care time)  Medications Ordered in UC Medications - No data to display  Initial Impression / Assessment and Plan / UC Course  I have reviewed the triage vital signs and the nursing notes.  Pertinent labs  & imaging results that were available during my  care of the patient were reviewed by me and considered in my medical decision making (see chart for details).  Patient is a nontoxic-appearing 70 year old male here for evaluation of 5 days worth of right-sided sore throat and right ear pain without other associated upper respiratory symptoms.  Patient's physical exam reveals pearly gray tympanic membranes bilaterally with a normal light reflex and clear external auditory canals.  Nasal mucosa is pink and moist without erythema, edema, or discharge.  Oropharyngeal exam reveals swelling to the posterior oropharynx on the right-hand side with yellow exudate.  The swelling extends past the midline.  When palpating the neck there is fullness palpated externally on the neck but there is no the cervical lymphadenopathy appreciated.  Cardiopulmonary exam is benign.  Patient's exam is consistent with a peritonsillar abscess.  COVID and rapid strep were collected at triage.  COVID back tomorrow with a rapid strep was negative.  Per protocol culture will be sent on the strep test.  I have advised the patient that his exam is consistent with a possible peritonsillar abscess and that he needs further evaluation in the emergency department as well as drainage of the abscess.  He is elected to go to [facility] ER via [place].  Patient left in stable condition.   Final Clinical Impressions(s) / UC Diagnoses   Final diagnoses:  Sore throat     Discharge Instructions      As we discussed, there is concern that you have an abscess in the back of your throat that will need to be evaluated with more advanced imaging and drained by ear nose and throat.  Therefore I am recommending go to the emergency department.  You have elected to go to [facility] in [place].     ED Prescriptions   None    PDMP not reviewed this encounter.   Margarette Canada, NP 05/25/21 1952

## 2021-05-25 NOTE — ED Triage Notes (Signed)
Pt is present with a sore throat and right ear pain. Pt states that his pain started Saturday

## 2021-05-25 NOTE — Discharge Instructions (Addendum)
As we discussed, there is concern that you have an abscess in the back of your throat that will need to be evaluated with more advanced imaging and drained by ear nose and throat.  Therefore I am recommending go to the emergency department.  You have elected to go to [facility] in [place].

## 2021-05-26 LAB — SARS CORONAVIRUS 2 (TAT 6-24 HRS): SARS Coronavirus 2: NEGATIVE

## 2021-06-11 ENCOUNTER — Other Ambulatory Visit: Payer: Self-pay | Admitting: Dermatology

## 2021-06-11 DIAGNOSIS — L603 Nail dystrophy: Secondary | ICD-10-CM

## 2021-06-12 ENCOUNTER — Encounter: Payer: Self-pay | Admitting: *Deleted

## 2021-06-13 ENCOUNTER — Ambulatory Visit
Admission: RE | Admit: 2021-06-13 | Discharge: 2021-06-13 | Disposition: A | Discharge status: Home / Self Care | Payer: BC Managed Care – PPO | Attending: Gastroenterology | Admitting: Gastroenterology

## 2021-06-13 ENCOUNTER — Ambulatory Visit: Payer: BC Managed Care – PPO | Admitting: Anesthesiology

## 2021-06-13 ENCOUNTER — Encounter
Admission: RE | Disposition: A | Discharge status: Home / Self Care | Payer: Self-pay | Source: Home / Self Care | Attending: Gastroenterology

## 2021-06-13 ENCOUNTER — Encounter: Payer: Self-pay | Admitting: *Deleted

## 2021-06-13 DIAGNOSIS — K6289 Other specified diseases of anus and rectum: Secondary | ICD-10-CM | POA: Insufficient documentation

## 2021-06-13 DIAGNOSIS — Z800 Family history of malignant neoplasm of digestive organs: Secondary | ICD-10-CM | POA: Diagnosis not present

## 2021-06-13 DIAGNOSIS — Z79899 Other long term (current) drug therapy: Secondary | ICD-10-CM | POA: Diagnosis not present

## 2021-06-13 DIAGNOSIS — E785 Hyperlipidemia, unspecified: Secondary | ICD-10-CM | POA: Insufficient documentation

## 2021-06-13 DIAGNOSIS — Z7982 Long term (current) use of aspirin: Secondary | ICD-10-CM | POA: Insufficient documentation

## 2021-06-13 DIAGNOSIS — Z86010 Personal history of colonic polyps: Secondary | ICD-10-CM | POA: Diagnosis not present

## 2021-06-13 DIAGNOSIS — Z1211 Encounter for screening for malignant neoplasm of colon: Secondary | ICD-10-CM | POA: Insufficient documentation

## 2021-06-13 DIAGNOSIS — K5730 Diverticulosis of large intestine without perforation or abscess without bleeding: Secondary | ICD-10-CM | POA: Insufficient documentation

## 2021-06-13 HISTORY — PX: COLONOSCOPY WITH PROPOFOL: SHX5780

## 2021-06-13 HISTORY — DX: Other trigeminal autonomic cephalgias (tac), not intractable: G44.099

## 2021-06-13 SURGERY — COLONOSCOPY WITH PROPOFOL
Anesthesia: General

## 2021-06-13 MED ORDER — PROPOFOL 500 MG/50ML IV EMUL
INTRAVENOUS | Status: DC | PRN
  Administered 2021-06-13: 150 ug/kg/min via INTRAVENOUS

## 2021-06-13 MED ORDER — PROPOFOL 500 MG/50ML IV EMUL
INTRAVENOUS | Status: AC
  Filled 2021-06-13: qty 50

## 2021-06-13 MED ORDER — EPHEDRINE 5 MG/ML INJ
INTRAVENOUS | Status: AC
  Filled 2021-06-13: qty 5

## 2021-06-13 MED ORDER — SODIUM CHLORIDE 0.9 % IV SOLN: INTRAVENOUS | Status: DC

## 2021-06-13 NOTE — Anesthesia Preprocedure Evaluation (Addendum)
Anesthesia Evaluation  Patient identified by MRN, date of birth, ID band Patient awake    Reviewed: Allergy & Precautions, NPO status   History of Anesthesia Complications Negative for: history of anesthetic complications  Airway Mallampati: II  TM Distance: >3 FB Neck ROM: Full    Dental no notable dental hx. (+) Teeth Intact   Pulmonary    Pulmonary exam normal        Cardiovascular Exercise Tolerance: Good Normal cardiovascular examI     Neuro/Psych    GI/Hepatic   Endo/Other    Renal/GU      Musculoskeletal   Abdominal Normal abdominal exam  (+)   Peds  Hematology   Anesthesia Other Findings   Reproductive/Obstetrics                             Anesthesia Physical Anesthesia Plan  ASA: 2  Anesthesia Plan: General   Post-op Pain Management:    Induction: Intravenous  PONV Risk Score and Plan:   Airway Management Planned: Simple Face Mask  Additional Equipment: None  Intra-op Plan:   Post-operative Plan:   Informed Consent: I have reviewed the patients History and Physical, chart, labs and discussed the procedure including the risks, benefits and alternatives for the proposed anesthesia with the patient or authorized representative who has indicated his/her understanding and acceptance.       Plan Discussed with: CRNA  Anesthesia Plan Comments:         Anesthesia Quick Evaluation

## 2021-06-13 NOTE — Interval H&P Note (Signed)
History and Physical Interval Note:  06/13/2021 8:19 AM  Matthew Sheppard  has presented today for surgery, with the diagnosis of history of adenomatous polyp of colon.  The various methods of treatment have been discussed with the patient and family. After consideration of risks, benefits and other options for treatment, the patient has consented to  Procedure(s) with comments: COLONOSCOPY WITH PROPOFOL (N/A) - Requesting 2nd or 3rd case as a surgical intervention.  The patient's history has been reviewed, patient examined, no change in status, stable for surgery.  I have reviewed the patient's chart and labs.  Questions were answered to the patient's satisfaction.     Lesly Rubenstein  Ok to proceed with colonoscopy

## 2021-06-13 NOTE — H&P (Signed)
Outpatient short stay form Pre-procedure 06/13/2021 8:17 AM Raylene Miyamoto MD, MPH  Primary Physician: Dr. Ellison Hughs  Reason for visit:  Surveillance  History of present illness:   70 y/o gentleman with history of IBS and TVA adenoma on colonoscopy done in 2016 here for surveillance colonoscopy. Sister with colon cancer at young age (78). No abdominal surgeries and no blood thinners.    Current Facility-Administered Medications:    0.9 %  sodium chloride infusion, , Intravenous, Continuous, Prosper Paff, Hilton Cork, MD, Last Rate: 20 mL/hr at 06/13/21 0744, New Bag at 06/13/21 0744  Medications Prior to Admission  Medication Sig Dispense Refill Last Dose   aspirin EC 81 MG tablet Take 81 mg by mouth daily.   06/12/2021   gabapentin (NEURONTIN) 800 MG tablet TAKE 1 TABLET(800 MG) BY MOUTH THREE TIMES DAILY   06/12/2021   hyoscyamine (ANASPAZ) 0.125 MG TBDP disintergrating tablet Place 0.125 mg under the tongue.   Past Month   indomethacin (INDOCIN) 50 MG capsule Take 50 mg by mouth 2 (two) times daily with a meal.   Past Month   Multiple Vitamin (MULTIVITAMIN) tablet Take 1 tablet by mouth daily.   06/12/2021   pravastatin (PRAVACHOL) 10 MG tablet Take 10 mg by mouth daily.   06/12/2021   psyllium (HYDROCIL/METAMUCIL) 95 % PACK Take 1 packet by mouth daily.   06/12/2021   rizatriptan (MAXALT) 10 MG tablet Take 10 mg by mouth as needed for migraine. May repeat in 2 hours if needed   Past Month   tobramycin (TOBREX) 0.3 % ophthalmic solution APPLY TO AFFECTED FINGERNAILS EVERY NIGHT AT BEDTIME 5 mL 0 06/12/2021   triamcinolone cream (KENALOG) 0.1 % APPLY SMALL AMOUNT EXTERNALLY TO THE AFFECTED AREA TWICE DAILY AS NEEDED. AVOID FACE, GROIN, UNDERARMS 80 g 0 Past Month   carbamazepine (TEGRETOL) 200 MG tablet Take 200 mg by mouth 3 (three) times daily. (Patient not taking: Reported on 06/13/2021)   Not Taking   doxycycline (ADOXA) 50 MG tablet TAKE 1 TABLET BY MOUTH ONCE A DAY WITH FOOD AND PLENTY OF FLUID 30  tablet 6 06/11/2021   doxycycline (MONODOX) 50 MG capsule TAKE 1 CAPSULE(50 MG) BY MOUTH DAILY 30 capsule 6 06/11/2021   doxycycline (VIBRAMYCIN) 50 MG capsule TAKE 1 CAPSULE BY MOUTH ONCE DAILY 30 capsule 0 06/11/2021   Erenumab-aooe 140 MG/ML SOAJ Inject into the skin every 28 (twenty-eight) days. (Patient not taking: Reported on 06/13/2021)   Not Taking     No Known Allergies   Past Medical History:  Diagnosis Date   Actinic keratosis 05/21/2007   Right cheek.    Actinic keratosis 06/24/2014   Mid to distal dorsum nose.    Headache    Hyperlipemia    IBS (irritable bowel syndrome)    Trigeminal autonomic cephalgias     Review of systems:  Otherwise negative.    Physical Exam  Gen: Alert, oriented. Appears stated age.  HEENT: PERRLA. Lungs: No respiratory distress CV: RRR Abd: soft, benign, no masses Ext: No edema    Planned procedures: Proceed with colonoscopy. The patient understands the nature of the planned procedure, indications, risks, alternatives and potential complications including but not limited to bleeding, infection, perforation, damage to internal organs and possible oversedation/side effects from anesthesia. The patient agrees and gives consent to proceed.  Please refer to procedure notes for findings, recommendations and patient disposition/instructions.     Raylene Miyamoto MD, MPH Gastroenterology 06/13/2021  8:17 AM

## 2021-06-13 NOTE — Transfer of Care (Signed)
Immediate Anesthesia Transfer of Care Note  Patient: Matthew Sheppard  Procedure(s) Performed: COLONOSCOPY WITH PROPOFOL  Patient Location: PACU  Anesthesia Type:General  Level of Consciousness: awake and sedated  Airway & Oxygen Therapy: Patient Spontanous Breathing and Patient connected to nasal cannula oxygen  Post-op Assessment: Report given to RN and Post -op Vital signs reviewed and stable  Post vital signs: Reviewed and stable  Last Vitals:  Vitals Value Taken Time  BP    Temp    Pulse    Resp    SpO2      Last Pain:  Vitals:   06/13/21 0723  TempSrc: Temporal  PainSc: 0-No pain         Complications: No notable events documented.

## 2021-06-13 NOTE — Anesthesia Postprocedure Evaluation (Signed)
Anesthesia Post Note  Patient: Matthew Sheppard  Procedure(s) Performed: COLONOSCOPY WITH PROPOFOL  Patient location during evaluation: PACU Anesthesia Type: General Level of consciousness: awake and alert Pain management: pain level controlled Vital Signs Assessment: post-procedure vital signs reviewed and stable Respiratory status: spontaneous breathing, nonlabored ventilation, respiratory function stable and patient connected to nasal cannula oxygen Cardiovascular status: stable and blood pressure returned to baseline Postop Assessment: no apparent nausea or vomiting Anesthetic complications: no   No notable events documented.   Last Vitals:  Vitals:   06/13/21 0723 06/13/21 0841  BP: 119/72 (!) 92/48  Pulse: 72 82  Resp: 18 16  Temp: 36.4 C (!) 36.3 C  SpO2: 100% 100%    Last Pain:  Vitals:   06/13/21 0841  TempSrc: Temporal  PainSc: Asleep                 Lennox Pippins

## 2021-06-13 NOTE — Op Note (Signed)
[facility] Gastroenterology Patient Name: Matthew Sheppard Procedure Date: 06/08/2021 2:52 PM MRN: [id number] Account #: [id number] Date of Birth: 08/06/1951 Admit Type: Outpatient Age: 70 Room: [facility] ENDO ROOM 1 Gender: Male Note Status: Finalized Procedure:             Colonoscopy Indications:           Screening in patient at increased risk: Colorectal                         cancer in sister before age 66, Surveillance: Personal                         history of adenomatous polyps on last colonoscopy > 5                         years ago Providers:             Andrey Farmer MD, MD Referring MD:          Sofie Hartigan (Referring MD) Medicines:             Monitored Anesthesia Care Complications:         No immediate complications. Procedure:             Pre-Anesthesia Assessment:                        - Prior to the procedure, a History and Physical was                         performed, and patient medications and allergies were                         reviewed. The patient is competent. The risks and                         benefits of the procedure and the sedation options and                         risks were discussed with the patient. All questions                         were answered and informed consent was obtained.                         Patient identification and proposed procedure were                         verified by the physician, the nurse, the anesthetist                         and the technician in the endoscopy suite. Mental                         Status Examination: alert and oriented. Airway                         Examination: normal oropharyngeal airway and neck  mobility. Respiratory Examination: clear to                         auscultation. CV Examination: normal. Prophylactic                         Antibiotics: The patient does not require prophylactic                         antibiotics. Prior  Anticoagulants: The patient has                         taken no previous anticoagulant or antiplatelet                         agents. ASA Grade Assessment: II - A patient with mild                         systemic disease. After reviewing the risks and                         benefits, the patient was deemed in satisfactory                         condition to undergo the procedure. The anesthesia                         plan was to use monitored anesthesia care (MAC).                         Immediately prior to administration of medications,                         the patient was re-assessed for adequacy to receive                         sedatives. The heart rate, respiratory rate, oxygen                         saturations, blood pressure, adequacy of pulmonary                         ventilation, and response to care were monitored                         throughout the procedure. The physical status of the                         patient was re-assessed after the procedure.                        After obtaining informed consent, the colonoscope was                         passed under direct vision. Throughout the procedure,                         the patient's blood pressure, pulse, and oxygen  saturations were monitored continuously. The                         Colonoscope was introduced through the anus and                         advanced to the the cecum, identified by appendiceal                         orifice and ileocecal valve. The colonoscopy was                         performed without difficulty. The patient tolerated                         the procedure well. The quality of the bowel                         preparation was excellent. Findings:      The perianal and digital rectal examinations were normal.      A few small-mouthed diverticula were found in the sigmoid colon.      Anal papilla(e) were hypertrophied.      The exam was  otherwise without abnormality on direct and retroflexion       views. Impression:            - Diverticulosis in the sigmoid colon.                        - Anal papilla(e) were hypertrophied.                        - The examination was otherwise normal on direct and                         retroflexion views.                        - No specimens collected. Recommendation:        - Discharge patient to home.                        - Resume previous diet.                        - Continue present medications.                        - Repeat colonoscopy in 5 years for surveillance.                        - Return to referring physician as previously                         scheduled. Procedure Code(s):     --- Professional ---                        Y3016, Colorectal cancer screening; colonoscopy on                         individual at high risk Diagnosis  Code(s):     --- Professional ---                        Z80.0, Family history of malignant neoplasm of                         digestive organs                        Z86.010, Personal history of colonic polyps                        K62.89, Other specified diseases of anus and rectum                        K57.30, Diverticulosis of large intestine without                         perforation or abscess without bleeding CPT copyright 2019 [employer]. All rights reserved. The codes documented in this report are preliminary and upon coder review may  be revised to meet current compliance requirements. Andrey Farmer MD, MD 06/13/2021 8:44:22 AM Number of Addenda: 0 Note Initiated On: 06/08/2021 2:52 PM Scope Withdrawal Time: 0 hours 7 minutes 19 seconds  Total Procedure Duration: 0 hours 12 minutes 8 seconds  Estimated Blood Loss:  Estimated blood loss: none.      [facility]

## 2021-06-13 NOTE — Anesthesia Procedure Notes (Signed)
Procedure Name: MAC Date/Time: 06/13/2021 8:22 AM Performed by: Vaughan Sine Pre-anesthesia Checklist: Patient identified, Emergency Drugs available, Suction available, Patient being monitored and Timeout performed Patient Re-evaluated:Patient Re-evaluated prior to induction Oxygen Delivery Method: Nasal cannula Preoxygenation: Pre-oxygenation with 100% oxygen Induction Type: IV induction Placement Confirmation: positive ETCO2 and CO2 detector

## 2021-06-14 ENCOUNTER — Encounter: Payer: Self-pay | Admitting: Gastroenterology

## 2021-08-23 ENCOUNTER — Ambulatory Visit: Payer: BC Managed Care – PPO | Admitting: Dermatology

## 2021-09-06 ENCOUNTER — Ambulatory Visit: Payer: BC Managed Care – PPO | Admitting: Dermatology

## 2021-09-06 ENCOUNTER — Other Ambulatory Visit: Payer: Self-pay

## 2021-09-06 DIAGNOSIS — L719 Rosacea, unspecified: Secondary | ICD-10-CM | POA: Diagnosis not present

## 2021-09-06 DIAGNOSIS — L308 Other specified dermatitis: Secondary | ICD-10-CM | POA: Diagnosis not present

## 2021-09-06 DIAGNOSIS — L720 Epidermal cyst: Secondary | ICD-10-CM

## 2021-09-06 DIAGNOSIS — Z1283 Encounter for screening for malignant neoplasm of skin: Secondary | ICD-10-CM | POA: Diagnosis not present

## 2021-09-06 DIAGNOSIS — L570 Actinic keratosis: Secondary | ICD-10-CM

## 2021-09-06 DIAGNOSIS — M720 Palmar fascial fibromatosis [Dupuytren]: Secondary | ICD-10-CM

## 2021-09-06 MED ORDER — OPZELURA 1.5 % EX CREA: 1.0000 "application " | TOPICAL_CREAM | CUTANEOUS | 6 refills | Status: AC

## 2021-09-06 NOTE — Patient Instructions (Signed)

## 2021-09-06 NOTE — Progress Notes (Signed)
   Follow-Up Visit   Subjective  Matthew Sheppard is a 70 y.o. male who presents for the following: Other (History of Aks - UBSE today). The patient presents for Upper Body Skin Exam (UBSE) for skin cancer screening and mole check.   The following portions of the chart were reviewed this encounter and updated as appropriate:   Tobacco  Allergies  Meds  Problems  Med Hx  Surg Hx  Fam Hx     Review of Systems:  No other skin or systemic complaints except as noted in HPI or Assessment and Plan.  Objective  Well appearing patient in no apparent distress; mood and affect are within normal limits.  All skin waist up examined.  Left mid back paraspinal 2.0 cm Subcutaneous nodule.   Scalp (5) Erythematous thin papules/macules with gritty scale.    Assessment & Plan  Epidermal inclusion cyst Left mid back paraspinal Discussed risk rupture/abscess. Recommend excision due to increase in size.  AK (actinic keratosis) (5) Scalp  Destruction of lesion - Scalp Complexity: simple   Destruction method: cryotherapy   Informed consent: discussed and consent obtained   Timeout:  patient name, date of birth, surgical site, and procedure verified Lesion destroyed using liquid nitrogen: Yes   Region frozen until ice ball extended beyond lesion: Yes   Outcome: patient tolerated procedure well with no complications   Post-procedure details: wound care instructions given    Rosacea Face Rosacea is a chronic progressive skin condition usually affecting the face of adults, causing redness and/or acne bumps. It is treatable but not curable. It sometimes affects the eyes (ocular rosacea) as well. It may respond to topical and/or systemic medication and can flare with stress, sun exposure, alcohol, exercise and some foods.  Daily application of broad spectrum spf 30+ sunscreen to face is recommended to reduce flares.  Well controlled - continue doxycycline 50 mg 1 po qd with food and plenty of  fluid  Related Medications doxycycline (MONODOX) 50 MG capsule TAKE 1 CAPSULE(50 MG) BY MOUTH DAILY  Dupuytren's contracture Left Hand - Anterior Discussed condition. Will recommend evaluation with orthopaedic hand surgeon if condition worsens.  Other eczema Legs D/C TMC Start Opzelura cream bid - sample given Atopic dermatitis (eczema) is a chronic, relapsing, pruritic condition that can significantly affect quality of life. It is often associated with allergic rhinitis and/or asthma and can require treatment with topical medications, phototherapy, or in severe cases biologic injectable medication (Dupixent; Adbry) or Oral JAK inhibitors.  Ruxolitinib Phosphate (OPZELURA) 1.5 % CREA - Legs Apply 1 application topically 2 (two) times a week.  Skin cancer screening  Return in about 6 months (around 03/06/2022).  I, Ashok Cordia, CMA, am acting as scribe for Sarina Ser, MD . Documentation: I have reviewed the above documentation for accuracy and completeness, and I agree with the above.  Sarina Ser, MD

## 2021-09-07 ENCOUNTER — Encounter: Payer: Self-pay | Admitting: Dermatology

## 2021-10-19 ENCOUNTER — Other Ambulatory Visit: Payer: Self-pay | Admitting: Dermatology

## 2021-10-19 DIAGNOSIS — L719 Rosacea, unspecified: Secondary | ICD-10-CM

## 2021-11-10 ENCOUNTER — Other Ambulatory Visit: Payer: Self-pay | Admitting: Dermatology

## 2021-11-10 DIAGNOSIS — L603 Nail dystrophy: Secondary | ICD-10-CM

## 2022-01-25 IMAGING — CR DG FINGER RING 2+V*L*
3 series · 3 of 3 positions shown · non-contrast
Comparison: None.

CLINICAL DATA: Pt c/o left ring finger pain, swelling and bruising.

EXAM:
LEFT RING FINGER 2+V

[finger ap]
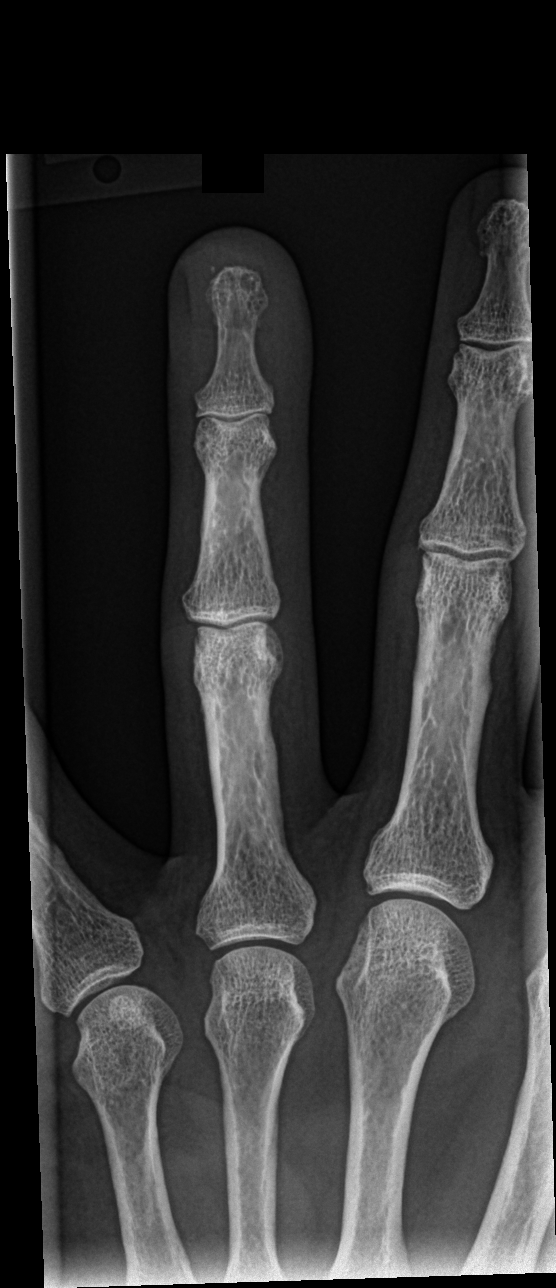

[finger obl]
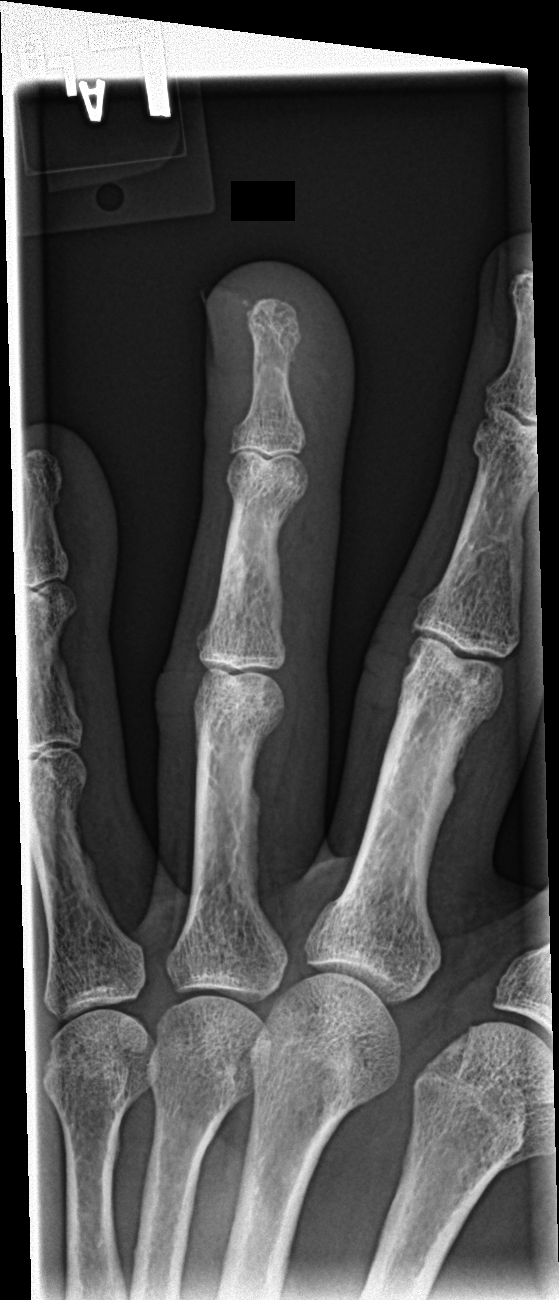

[finger lat]
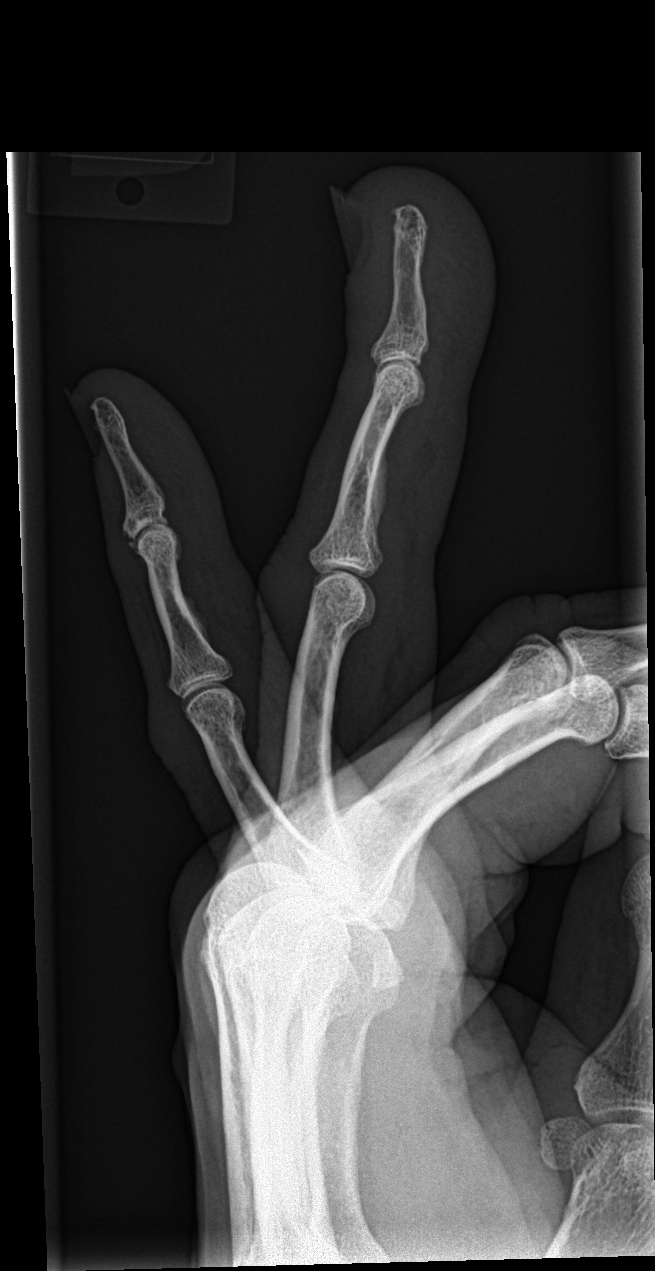

[3 of 3 positions shown; findings below may reference images not displayed]

FINDINGS: There is no evidence of fracture or dislocation. There is no
evidence of arthropathy or other focal bone abnormality. Punctate
density projecting at the nail bed
IMPRESSION: 1.  There is no acute osseous abnormality in the left ring finger.

2. Punctate density projecting at the nail bed of uncertain
etiology, possibly foreign material.

## 2022-03-21 ENCOUNTER — Ambulatory Visit (INDEPENDENT_AMBULATORY_CARE_PROVIDER_SITE_OTHER): Payer: Medicare Other | Admitting: Dermatology

## 2022-03-21 DIAGNOSIS — L570 Actinic keratosis: Secondary | ICD-10-CM | POA: Diagnosis not present

## 2022-03-21 DIAGNOSIS — L578 Other skin changes due to chronic exposure to nonionizing radiation: Secondary | ICD-10-CM

## 2022-03-21 NOTE — Patient Instructions (Signed)

## 2022-03-21 NOTE — Progress Notes (Unsigned)
   Follow-Up Visit   Subjective  Matthew Sheppard is a 71 y.o. male who presents for the following: Actinic Keratosis (Check today for any new or persistent precancerous skin lesions on the scalp ). The patient has spots, moles and lesions to be evaluated, some may be new or changing and the patient has concerns that these could be cancer.  The following portions of the chart were reviewed this encounter and updated as appropriate:   Tobacco  Allergies  Meds  Problems  Med Hx  Surg Hx  Fam Hx     Review of Systems:  No other skin or systemic complaints except as noted in HPI or Assessment and Plan.  Objective  Well appearing patient in no apparent distress; mood and affect are within normal limits.  A focused examination was performed including the face and scalp . Relevant physical exam findings are noted in the Assessment and Plan.  Scalp x 1 Erythematous thin papules/macules with gritty scale.    Assessment & Plan  AK (actinic keratosis) Scalp x 1  Destruction of lesion - Scalp x 1 Complexity: simple   Destruction method: cryotherapy   Informed consent: discussed and consent obtained   Timeout:  patient name, date of birth, surgical site, and procedure verified Lesion destroyed using liquid nitrogen: Yes   Region frozen until ice ball extended beyond lesion: Yes   Outcome: patient tolerated procedure well with no complications   Post-procedure details: wound care instructions given    Actinic Damage - chronic, secondary to cumulative UV radiation exposure/sun exposure over time - diffuse scaly erythematous macules with underlying dyspigmentation - Recommend daily broad spectrum sunscreen SPF 30+ to sun-exposed areas, reapply every 2 hours as needed.  - Recommend staying in the shade or wearing long sleeves, sun glasses (UVA+UVB protection) and wide brim hats (4-inch brim around the entire circumference of the hat). - Call for new or changing lesions.  Return in  about 6 months (around 09/21/2022) for TBSE.  Matthew Sheppard, CMA, am acting as scribe for Sarina Ser, MD . Documentation: I have reviewed the above documentation for accuracy and completeness, and I agree with the above.  Sarina Ser, MD

## 2022-03-31 ENCOUNTER — Encounter: Payer: Self-pay | Admitting: Dermatology

## 2022-05-13 ENCOUNTER — Other Ambulatory Visit: Payer: Self-pay | Admitting: Dermatology

## 2022-05-13 DIAGNOSIS — L719 Rosacea, unspecified: Secondary | ICD-10-CM

## 2022-06-23 ENCOUNTER — Other Ambulatory Visit: Payer: Self-pay | Admitting: Dermatology

## 2022-06-25 ENCOUNTER — Telehealth: Payer: Self-pay

## 2022-06-25 NOTE — Telephone Encounter (Signed)
This patient called and would like to know the probable coding that will be used for the excision of the cyst on his back? Patient is interested in having it removed but wants to call his insurance first and confirm what he will have to pay out of pocket before scheduling this. Please advise.

## 2022-06-25 NOTE — Telephone Encounter (Signed)
Patient called and advised of information. He is going to call his insurance company and if that is all agreeable he will call back here to schedule cyst excision.

## 2022-06-27 NOTE — Telephone Encounter (Signed)
Patient called and wanted me to call the PA department at [facility] to ensure that procedure did not need a prior approval beforehand. Procedure codes were given and was told that no prior authorization is needed for these procedure codes. Patient already called himself and confirmed what would be covered and what would be OOP for this procedure. MNO#[id number]

## 2022-07-31 ENCOUNTER — Ambulatory Visit (INDEPENDENT_AMBULATORY_CARE_PROVIDER_SITE_OTHER): Payer: Medicare Other | Admitting: Dermatology

## 2022-07-31 DIAGNOSIS — L720 Epidermal cyst: Secondary | ICD-10-CM | POA: Diagnosis not present

## 2022-07-31 DIAGNOSIS — D485 Neoplasm of uncertain behavior of skin: Secondary | ICD-10-CM

## 2022-07-31 MED ORDER — MUPIROCIN 2 % EX OINT: TOPICAL_OINTMENT | CUTANEOUS | 0 refills | Status: AC

## 2022-07-31 NOTE — Patient Instructions (Addendum)
Wound Care Instructions  Cleanse wound gently with soap and water once a day then pat dry with clean gauze. Apply a thin coat of Petrolatum (petroleum jelly, "Vaseline") over the wound (unless you have an allergy to this). We recommend that you use a new, sterile tube of Vaseline. Do not pick or remove scabs. Do not remove the yellow or white "healing tissue" from the base of the wound.  Cover the wound with fresh, clean, nonstick gauze and secure with paper tape. You may use Band-Aids in place of gauze and tape if the wound is small enough, but would recommend trimming much of the tape off as there is often too much. Sometimes Band-Aids can irritate the skin.  You should call the office for your biopsy report after 1 week if you have not already been contacted.  If you experience any problems, such as abnormal amounts of bleeding, swelling, significant bruising, significant pain, or evidence of infection, please call the office immediately.  FOR ADULT SURGERY PATIENTS: If you need something for pain relief you may take 1 extra strength Tylenol (acetaminophen) AND 2 Ibuprofen (200mg each) together every 4 hours as needed for pain. (do not take these if you are allergic to them or if you have a reason you should not take them.) Typically, you may only need pain medication for 1 to 3 days.     Due to recent changes in healthcare laws, you may see results of your pathology and/or laboratory studies on MyChart before the doctors have had a chance to review them. We understand that in some cases there may be results that are confusing or concerning to you. Please understand that not all results are received at the same time and often the doctors may need to interpret multiple results in order to provide you with the best plan of care or course of treatment. Therefore, we ask that you please give us 2 business days to thoroughly review all your results before contacting the office for clarification. Should  we see a critical lab result, you will be contacted sooner.   If You Need Anything After Your Visit  If you have any questions or concerns for your doctor, please call our main line at [phone] and press option 4 to reach your doctor's medical assistant. If no one answers, please leave a voicemail as directed and we will return your call as soon as possible. Messages left after 4 pm will be answered the following business day.   You may also send us a message via MyChart. We typically respond to MyChart messages within 1-2 business days.  For prescription refills, please ask your pharmacy to contact our office. Our fax number is [phone].  If you have an urgent issue when the clinic is closed that cannot wait until the next business day, you can page your doctor at the number below.    Please note that while we do our best to be available for urgent issues outside of office hours, we are not available 24/7.   If you have an urgent issue and are unable to reach us, you may choose to seek medical care at your doctor's office, retail clinic, urgent care center, or emergency room.  If you have a medical emergency, please immediately call 911 or go to the emergency department.  Pager Numbers  - Dr. Kowalski: [phone]  - Dr. Moye: [phone]  - Dr. Stewart: [phone]  In the event of inclement weather, please call our main line at   [phone] for an update on the status of any delays or closures.  Dermatology Medication Tips: Please keep the boxes that topical medications come in in order to help keep track of the instructions about where and how to use these. Pharmacies typically print the medication instructions only on the boxes and not directly on the medication tubes.   If your medication is too expensive, please contact our office at [phone] option 4 or send us a message through MyChart.   We are unable to tell what your co-pay for medications will be in  advance as this is different depending on your insurance coverage. However, we may be able to find a substitute medication at lower cost or fill out paperwork to get insurance to cover a needed medication.   If a prior authorization is required to get your medication covered by your insurance company, please allow us 1-2 business days to complete this process.  Drug prices often vary depending on where the prescription is filled and some pharmacies may offer cheaper prices.  The website www.goodrx.com contains coupons for medications through different pharmacies. The prices here do not account for what the cost may be with help from insurance (it may be cheaper with your insurance), but the website can give you the price if you did not use any insurance.  - You can print the associated coupon and take it with your prescription to the pharmacy.  - You may also stop by our office during regular business hours and pick up a GoodRx coupon card.  - If you need your prescription sent electronically to a different pharmacy, notify our office through [place] MyChart or by phone at [phone] option 4.     Si Usted Necesita Algo Despus de Su Visita  Tambin puede enviarnos un mensaje a travs de MyChart. Por lo general respondemos a los mensajes de MyChart en el transcurso de 1 a 2 das hbiles.  Para renovar recetas, por favor pida a su farmacia que se ponga en contacto con nuestra oficina. Nuestro nmero de fax es el [phone].  Si tiene un asunto urgente cuando la clnica est cerrada y que no puede esperar hasta el siguiente da hbil, puede llamar/localizar a su doctor(a) al nmero que aparece a continuacin.   Por favor, tenga en cuenta que aunque hacemos todo lo posible para estar disponibles para asuntos urgentes fuera del horario de oficina, no estamos disponibles las 24 horas del da, los 7 das de la semana.   Si tiene un problema urgente y no puede comunicarse con nosotros, puede  optar por buscar atencin mdica  en el consultorio de su doctor(a), en una clnica privada, en un centro de atencin urgente o en una sala de emergencias.  Si tiene una emergencia mdica, por favor llame inmediatamente al 911 o vaya a la sala de emergencias.  Nmeros de bper  - Dr. Kowalski: [phone]  - Dra. Moye: [phone]  - Dra. Stewart: [phone]  En caso de inclemencias del tiempo, por favor llame a nuestra lnea principal al [phone] para una actualizacin sobre el estado de cualquier retraso o cierre.  Consejos para la medicacin en dermatologa: Por favor, guarde las cajas en las que vienen los medicamentos de uso tpico para ayudarle a seguir las instrucciones sobre dnde y cmo usarlos. Las farmacias generalmente imprimen las instrucciones del medicamento slo en las cajas y no directamente en los tubos del medicamento.   Si su medicamento es muy caro, por favor, pngase en contacto con   nuestra oficina llamando al [phone] y presione la opcin 4 o envenos un mensaje a travs de MyChart.   No podemos decirle cul ser su copago por los medicamentos por adelantado ya que esto es diferente dependiendo de la cobertura de su seguro. Sin embargo, es posible que podamos encontrar un medicamento sustituto a menor costo o llenar un formulario para que el seguro cubra el medicamento que se considera necesario.   Si se requiere una autorizacin previa para que su compaa de seguros cubra su medicamento, por favor permtanos de 1 a 2 das hbiles para completar este proceso.  Los precios de los medicamentos varan con frecuencia dependiendo del lugar de dnde se surte la receta y alguna farmacias pueden ofrecer precios ms baratos.  El sitio web www.goodrx.com tiene cupones para medicamentos de diferentes farmacias. Los precios aqu no tienen en cuenta lo que podra costar con la ayuda del seguro (puede ser ms barato con su seguro), pero el sitio web puede darle el  precio si no utiliz ningn seguro.  - Puede imprimir el cupn correspondiente y llevarlo con su receta a la farmacia.  - Tambin puede pasar por nuestra oficina durante el horario de atencin regular y recoger una tarjeta de cupones de GoodRx.  - Si necesita que su receta se enve electrnicamente a una farmacia diferente, informe a nuestra oficina a travs de MyChart de [place] o por telfono llamando al [phone] y presione la opcin 4.  

## 2022-07-31 NOTE — Progress Notes (Unsigned)
   Follow-Up Visit   Subjective  Matthew Sheppard is a 72 y.o. male who presents for the following: Cyst (Of the L mid back paraspinal - patient is here today for excision).  The following portions of the chart were reviewed this encounter and updated as appropriate:   Tobacco  Allergies  Meds  Problems  Med Hx  Surg Hx  Fam Hx     Review of Systems:  No other skin or systemic complaints except as noted in HPI or Assessment and Plan.  Objective  Well appearing patient in no apparent distress; mood and affect are within normal limits.  A focused examination was performed including the face, trunk, and extremities. Relevant physical exam findings are noted in the Assessment and Plan.  Left mid back paraspinal Firm SQ nodule 3.5 cm   Assessment & Plan  Neoplasm of uncertain behavior of skin -suspected growing symptomatic cyst Left mid back paraspinal  Skin excision  Lesion length (cm):  3.5 Lesion width (cm):  3.5 Margin per side (cm):  0 Total excision diameter (cm):  3.5 Informed consent: discussed and consent obtained   Timeout: patient name, date of birth, surgical site, and procedure verified   Procedure prep:  Patient was prepped and draped in usual sterile fashion Prep type:  Isopropyl alcohol and povidone-iodine Anesthesia: the lesion was anesthetized in a standard fashion   Anesthetic:  1% lidocaine w/ epinephrine 1-100,000 buffered w/ 8.4% NaHCO3 Hemostasis achieved with: pressure   Hemostasis achieved with comment:  Electrocautery Outcome: patient tolerated procedure well with no complications   Post-procedure details: sterile dressing applied and wound care instructions given   Dressing type: bandage and pressure dressing    Skin repair Complexity:  Complex Final length (cm):  5 Informed consent: discussed and consent obtained   Timeout: patient name, date of birth, surgical site, and procedure verified   Procedure prep:  Patient was prepped and draped in  usual sterile fashion Prep type:  Povidone-iodine Anesthesia: the lesion was anesthetized in a standard fashion   Anesthesia comment:  6 cc lidocaine with epi 6 cc bupivocaine Reason for type of repair: reduce tension to allow closure, reduce the risk of dehiscence, infection, and necrosis, reduce subcutaneous dead space and avoid a hematoma, allow closure of the large defect, preserve normal anatomy, preserve normal anatomical and functional relationships and enhance both functionality and cosmetic results   Undermining comment:  3.5 Subcutaneous layers (deep stitches):  Suture size:  2-0 Suture type: Vicryl (polyglactin 910)   Fine/surface layer approximation (top stitches):  Suture size:  2-0 Suture type: nylon   Stitches: horizontal mattress   Suture removal (days):  7 Hemostasis achieved with: suture and pressure Outcome: patient tolerated procedure well with no complications   Post-procedure details: sterile dressing applied and wound care instructions given   Dressing type: bandage and pressure dressing    mupirocin ointment (BACTROBAN) 2 % Apply to wound QD.  Specimen 1 - Surgical pathology Differential Diagnosis: D48.5 r/o cyst vs other  Check Margins: No  Start Mupirocin 2% ointment apply to aa QD until follow up appointment.   Return in about 1 week (around 08/07/2022) for suture removal.  I, Rudell Cobb, CMA, am acting as scribe for Sarina Ser, MD . Documentation: I have reviewed the above documentation for accuracy and completeness, and I agree with the above.  Sarina Ser, MD

## 2022-08-01 ENCOUNTER — Telehealth: Payer: Self-pay

## 2022-08-01 ENCOUNTER — Encounter: Payer: Self-pay | Admitting: Dermatology

## 2022-08-01 NOTE — Telephone Encounter (Signed)
Pt doing fine after yesterday's surgery.  Pt had a little pain and said the Tylenol helped.  Advised if that doesn't help in future, he could take 1 Extra Strength Tylenol and 2 200 Ibuprofen./sh

## 2022-08-07 ENCOUNTER — Ambulatory Visit (INDEPENDENT_AMBULATORY_CARE_PROVIDER_SITE_OTHER): Payer: Medicare Other | Admitting: Dermatology

## 2022-08-07 DIAGNOSIS — Z4802 Encounter for removal of sutures: Secondary | ICD-10-CM

## 2022-08-07 DIAGNOSIS — L720 Epidermal cyst: Secondary | ICD-10-CM

## 2022-08-07 NOTE — Patient Instructions (Signed)
Due to recent changes in healthcare laws, you may see results of your pathology and/or laboratory studies on MyChart before the doctors have had a chance to review them. We understand that in some cases there may be results that are confusing or concerning to you. Please understand that not all results are received at the same time and often the doctors may need to interpret multiple results in order to provide you with the best plan of care or course of treatment. Therefore, we ask that you please give us 2 business days to thoroughly review all your results before contacting the office for clarification. Should we see a critical lab result, you will be contacted sooner.   If You Need Anything After Your Visit  If you have any questions or concerns for your doctor, please call our main line at [phone] and press option 4 to reach your doctor's medical assistant. If no one answers, please leave a voicemail as directed and we will return your call as soon as possible. Messages left after 4 pm will be answered the following business day.   You may also send us a message via MyChart. We typically respond to MyChart messages within 1-2 business days.  For prescription refills, please ask your pharmacy to contact our office. Our fax number is [phone].  If you have an urgent issue when the clinic is closed that cannot wait until the next business day, you can page your doctor at the number below.    Please note that while we do our best to be available for urgent issues outside of office hours, we are not available 24/7.   If you have an urgent issue and are unable to reach us, you may choose to seek medical care at your doctor's office, retail clinic, urgent care center, or emergency room.  If you have a medical emergency, please immediately call 911 or go to the emergency department.  Pager Numbers  - Dr. Kowalski: [phone]  - Dr. Moye: [phone]  - Dr. Stewart:  [phone]  In the event of inclement weather, please call our main line at [phone] for an update on the status of any delays or closures.  Dermatology Medication Tips: Please keep the boxes that topical medications come in in order to help keep track of the instructions about where and how to use these. Pharmacies typically print the medication instructions only on the boxes and not directly on the medication tubes.   If your medication is too expensive, please contact our office at [phone] option 4 or send us a message through MyChart.   We are unable to tell what your co-pay for medications will be in advance as this is different depending on your insurance coverage. However, we may be able to find a substitute medication at lower cost or fill out paperwork to get insurance to cover a needed medication.   If a prior authorization is required to get your medication covered by your insurance company, please allow us 1-2 business days to complete this process.  Drug prices often vary depending on where the prescription is filled and some pharmacies may offer cheaper prices.  The website www.goodrx.com contains coupons for medications through different pharmacies. The prices here do not account for what the cost may be with help from insurance (it may be cheaper with your insurance), but the website can give you the price if you did not use any insurance.  - You can print the associated coupon and take it with   your prescription to the pharmacy.  - You may also stop by our office during regular business hours and pick up a GoodRx coupon card.  - If you need your prescription sent electronically to a different pharmacy, notify our office through [facility] MyChart or by phone at [phone] option 4.     Si Usted Necesita Algo Despus de Su Visita  Tambin puede enviarnos un mensaje a travs de MyChart. Por lo general respondemos a los mensajes de MyChart en el transcurso de 1 a 2  das hbiles.  Para renovar recetas, por favor pida a su farmacia que se ponga en contacto con nuestra oficina. Nuestro nmero de fax es el [phone].  Si tiene un asunto urgente cuando la clnica est cerrada y que no puede esperar hasta el siguiente da hbil, puede llamar/localizar a su doctor(a) al nmero que aparece a continuacin.   Por favor, tenga en cuenta que aunque hacemos todo lo posible para estar disponibles para asuntos urgentes fuera del horario de oficina, no estamos disponibles las 24 horas del da, los 7 das de la semana.   Si tiene un problema urgente y no puede comunicarse con nosotros, puede optar por buscar atencin mdica  en el consultorio de su doctor(a), en una clnica privada, en un centro de atencin urgente o en una sala de emergencias.  Si tiene una emergencia mdica, por favor llame inmediatamente al 911 o vaya a la sala de emergencias.  Nmeros de bper  - Dr. Kowalski: [phone]  - Dra. Moye: [phone]  - Dra. Stewart: [phone]  En caso de inclemencias del tiempo, por favor llame a nuestra lnea principal al [phone] para una actualizacin sobre el estado de cualquier retraso o cierre.  Consejos para la medicacin en dermatologa: Por favor, guarde las cajas en las que vienen los medicamentos de uso tpico para ayudarle a seguir las instrucciones sobre dnde y cmo usarlos. Las farmacias generalmente imprimen las instrucciones del medicamento slo en las cajas y no directamente en los tubos del medicamento.   Si su medicamento es muy caro, por favor, pngase en contacto con nuestra oficina llamando al [phone] y presione la opcin 4 o envenos un mensaje a travs de MyChart.   No podemos decirle cul ser su copago por los medicamentos por adelantado ya que esto es diferente dependiendo de la cobertura de su seguro. Sin embargo, es posible que podamos encontrar un medicamento sustituto a menor costo o llenar un formulario para que el  seguro cubra el medicamento que se considera necesario.   Si se requiere una autorizacin previa para que su compaa de seguros cubra su medicamento, por favor permtanos de 1 a 2 das hbiles para completar este proceso.  Los precios de los medicamentos varan con frecuencia dependiendo del lugar de dnde se surte la receta y alguna farmacias pueden ofrecer precios ms baratos.  El sitio web www.goodrx.com tiene cupones para medicamentos de diferentes farmacias. Los precios aqu no tienen en cuenta lo que podra costar con la ayuda del seguro (puede ser ms barato con su seguro), pero el sitio web puede darle el precio si no utiliz ningn seguro.  - Puede imprimir el cupn correspondiente y llevarlo con su receta a la farmacia.  - Tambin puede pasar por nuestra oficina durante el horario de atencin regular y recoger una tarjeta de cupones de GoodRx.  - Si necesita que su receta se enve electrnicamente a una farmacia diferente, informe a nuestra oficina a travs de MyChart de [place]   o por telfono llamando al [phone] y presione la opcin 4.  

## 2022-08-07 NOTE — Progress Notes (Unsigned)
   Follow-Up Visit   Subjective  Matthew Sheppard is a 71 y.o. male who presents for the following: Other (Post op - Cyst of left mid back paraspinal).  The following portions of the chart were reviewed this encounter and updated as appropriate:   Tobacco  Allergies  Meds  Problems  Med Hx  Surg Hx  Fam Hx     Review of Systems:  No other skin or systemic complaints except as noted in HPI or Assessment and Plan.  Objective  Well appearing patient in no apparent distress; mood and affect are within normal limits.  A focused examination was performed including back. Relevant physical exam findings are noted in the Assessment and Plan.  Left mid back paraspinal Healing excision site   Assessment & Plan  Epidermal inclusion cyst Left mid back paraspinal  Encounter for Removal of Sutures - Incision site at the left mid back paraspinal is clean, dry and intact - Wound cleansed, sutures removed, wound cleansed and steri strips applied.  - Discussed pathology results showing cyst  - Patient advised to keep steri-strips dry until they fall off. - Scars remodel for a full year. - Once steri-strips fall off, patient can apply over-the-counter silicone scar cream each night to help with scar remodeling if desired. - Patient advised to call with any concerns or if they notice any new or changing lesions.  Return for Follow up as scheduled.  I, Ashok Cordia, CMA, am acting as scribe for Sarina Ser, MD . Documentation: I have reviewed the above documentation for accuracy and completeness, and I agree with the above.  Sarina Ser, MD

## 2022-08-08 ENCOUNTER — Encounter: Payer: Self-pay | Admitting: Dermatology

## 2022-09-19 ENCOUNTER — Ambulatory Visit (INDEPENDENT_AMBULATORY_CARE_PROVIDER_SITE_OTHER): Payer: Medicare Other | Admitting: Dermatology

## 2022-09-19 DIAGNOSIS — L719 Rosacea, unspecified: Secondary | ICD-10-CM | POA: Diagnosis not present

## 2022-09-19 DIAGNOSIS — L814 Other melanin hyperpigmentation: Secondary | ICD-10-CM | POA: Diagnosis not present

## 2022-09-19 DIAGNOSIS — L570 Actinic keratosis: Secondary | ICD-10-CM | POA: Diagnosis not present

## 2022-09-19 DIAGNOSIS — L578 Other skin changes due to chronic exposure to nonionizing radiation: Secondary | ICD-10-CM | POA: Diagnosis not present

## 2022-09-19 DIAGNOSIS — D229 Melanocytic nevi, unspecified: Secondary | ICD-10-CM

## 2022-09-19 DIAGNOSIS — Z1283 Encounter for screening for malignant neoplasm of skin: Secondary | ICD-10-CM

## 2022-09-19 DIAGNOSIS — L821 Other seborrheic keratosis: Secondary | ICD-10-CM

## 2022-09-19 NOTE — Patient Instructions (Signed)
Due to recent changes in healthcare laws, you may see results of your pathology and/or laboratory studies on MyChart before the doctors have had a chance to review them. We understand that in some cases there may be results that are confusing or concerning to you. Please understand that not all results are received at the same time and often the doctors may need to interpret multiple results in order to provide you with the best plan of care or course of treatment. Therefore, we ask that you please give us 2 business days to thoroughly review all your results before contacting the office for clarification. Should we see a critical lab result, you will be contacted sooner.   If You Need Anything After Your Visit  If you have any questions or concerns for your doctor, please call our main line at [phone] and press option 4 to reach your doctor's medical assistant. If no one answers, please leave a voicemail as directed and we will return your call as soon as possible. Messages left after 4 pm will be answered the following business day.   You may also send us a message via MyChart. We typically respond to MyChart messages within 1-2 business days.  For prescription refills, please ask your pharmacy to contact our office. Our fax number is [phone].  If you have an urgent issue when the clinic is closed that cannot wait until the next business day, you can page your doctor at the number below.    Please note that while we do our best to be available for urgent issues outside of office hours, we are not available 24/7.   If you have an urgent issue and are unable to reach us, you may choose to seek medical care at your doctor's office, retail clinic, urgent care center, or emergency room.  If you have a medical emergency, please immediately call 911 or go to the emergency department.  Pager Numbers  - Dr. Kowalski: [phone]  - Dr. Moye: [phone]  - Dr. Stewart:  [phone]  In the event of inclement weather, please call our main line at [phone] for an update on the status of any delays or closures.  Dermatology Medication Tips: Please keep the boxes that topical medications come in in order to help keep track of the instructions about where and how to use these. Pharmacies typically print the medication instructions only on the boxes and not directly on the medication tubes.   If your medication is too expensive, please contact our office at [phone] option 4 or send us a message through MyChart.   We are unable to tell what your co-pay for medications will be in advance as this is different depending on your insurance coverage. However, we may be able to find a substitute medication at lower cost or fill out paperwork to get insurance to cover a needed medication.   If a prior authorization is required to get your medication covered by your insurance company, please allow us 1-2 business days to complete this process.  Drug prices often vary depending on where the prescription is filled and some pharmacies may offer cheaper prices.  The website www.goodrx.com contains coupons for medications through different pharmacies. The prices here do not account for what the cost may be with help from insurance (it may be cheaper with your insurance), but the website can give you the price if you did not use any insurance.  - You can print the associated coupon and take it with   your prescription to the pharmacy.  - You may also stop by our office during regular business hours and pick up a GoodRx coupon card.  - If you need your prescription sent electronically to a different pharmacy, notify our office through [facility] MyChart or by phone at [phone] option 4.     Si Usted Necesita Algo Despus de Su Visita  Tambin puede enviarnos un mensaje a travs de MyChart. Por lo general respondemos a los mensajes de MyChart en el transcurso de 1 a 2  das hbiles.  Para renovar recetas, por favor pida a su farmacia que se ponga en contacto con nuestra oficina. Nuestro nmero de fax es el [phone].  Si tiene un asunto urgente cuando la clnica est cerrada y que no puede esperar hasta el siguiente da hbil, puede llamar/localizar a su doctor(a) al nmero que aparece a continuacin.   Por favor, tenga en cuenta que aunque hacemos todo lo posible para estar disponibles para asuntos urgentes fuera del horario de oficina, no estamos disponibles las 24 horas del da, los 7 das de la semana.   Si tiene un problema urgente y no puede comunicarse con nosotros, puede optar por buscar atencin mdica  en el consultorio de su doctor(a), en una clnica privada, en un centro de atencin urgente o en una sala de emergencias.  Si tiene una emergencia mdica, por favor llame inmediatamente al 911 o vaya a la sala de emergencias.  Nmeros de bper  - Dr. Kowalski: [phone]  - Dra. Moye: [phone]  - Dra. Stewart: [phone]  En caso de inclemencias del tiempo, por favor llame a nuestra lnea principal al [phone] para una actualizacin sobre el estado de cualquier retraso o cierre.  Consejos para la medicacin en dermatologa: Por favor, guarde las cajas en las que vienen los medicamentos de uso tpico para ayudarle a seguir las instrucciones sobre dnde y cmo usarlos. Las farmacias generalmente imprimen las instrucciones del medicamento slo en las cajas y no directamente en los tubos del medicamento.   Si su medicamento es muy caro, por favor, pngase en contacto con nuestra oficina llamando al [phone] y presione la opcin 4 o envenos un mensaje a travs de MyChart.   No podemos decirle cul ser su copago por los medicamentos por adelantado ya que esto es diferente dependiendo de la cobertura de su seguro. Sin embargo, es posible que podamos encontrar un medicamento sustituto a menor costo o llenar un formulario para que el  seguro cubra el medicamento que se considera necesario.   Si se requiere una autorizacin previa para que su compaa de seguros cubra su medicamento, por favor permtanos de 1 a 2 das hbiles para completar este proceso.  Los precios de los medicamentos varan con frecuencia dependiendo del lugar de dnde se surte la receta y alguna farmacias pueden ofrecer precios ms baratos.  El sitio web www.goodrx.com tiene cupones para medicamentos de diferentes farmacias. Los precios aqu no tienen en cuenta lo que podra costar con la ayuda del seguro (puede ser ms barato con su seguro), pero el sitio web puede darle el precio si no utiliz ningn seguro.  - Puede imprimir el cupn correspondiente y llevarlo con su receta a la farmacia.  - Tambin puede pasar por nuestra oficina durante el horario de atencin regular y recoger una tarjeta de cupones de GoodRx.  - Si necesita que su receta se enve electrnicamente a una farmacia diferente, informe a nuestra oficina a travs de MyChart de [place]   o por telfono llamando al [phone] y presione la opcin 4.  

## 2022-09-19 NOTE — Progress Notes (Signed)
Follow-Up Visit   Subjective  Matthew Sheppard is a 71 y.o. male who presents for the following: UBSE (Hx of Aks ). The patient presents for Upper Body Skin Exam (UBSE) for skin cancer screening and mole check.  The patient has spots, moles and lesions to be evaluated, some may be new or changing and the patient has concerns that these could be cancer.  The following portions of the chart were reviewed this encounter and updated as appropriate:   Tobacco  Allergies  Meds  Problems  Med Hx  Surg Hx  Fam Hx     Review of Systems:  No other skin or systemic complaints except as noted in HPI or Assessment and Plan.  Objective  Well appearing patient in no apparent distress; mood and affect are within normal limits.  All skin waist up examined.  L ear x 1, scalp x 3 (4) Erythematous thin papules/macules with gritty scale.   Face Diffuse scaly erythematous macules with underlying dyspigmentation.   Face Mid face erythema with telangiectasias +/- scattered inflammatory papules.    Assessment & Plan  AK (actinic keratosis) (4) L ear x 1, scalp x 3 Destruction of lesion - L ear x 1, scalp x 3 Complexity: simple   Destruction method: cryotherapy   Informed consent: discussed and consent obtained   Timeout:  patient name, date of birth, surgical site, and procedure verified Lesion destroyed using liquid nitrogen: Yes   Region frozen until ice ball extended beyond lesion: Yes   Outcome: patient tolerated procedure well with no complications   Post-procedure details: wound care instructions given    Actinic skin damage Face Discussed Tretinoin cream QHS. Topical retinoid medications like tretinoin/Retin-A, adapalene/Differin, tazarotene/Fabior, and Epiduo/Epiduo Forte can cause dryness and irritation when first started. Only apply a pea-sized amount to the entire affected area. Avoid applying it around the eyes, edges of mouth and creases at the nose. If you experience  irritation, use a good moisturizer first and/or apply the medicine less often. If you are doing well with the medicine, you can increase how often you use it until you are applying every night. Be careful with sun protection while using this medication as it can make you sensitive to the sun. This medicine should not be used by pregnant women.   Patient deferred treatment at this time.  Rosacea Face Rosacea is a chronic progressive skin condition usually affecting the face of adults, causing redness and/or acne bumps. It is treatable but not curable. It sometimes affects the eyes (ocular rosacea) as well. It may respond to topical and/or systemic medication and can flare with stress, sun exposure, alcohol, exercise, topical steroids (including hydrocortisone/cortisone 10) and some foods.  Daily application of broad spectrum spf 30+ sunscreen to face is recommended to reduce flares.  Continue Doxycycline '50mg'$  QD. Doxycycline should be taken with food to prevent nausea. Do not lay down for 30 minutes after taking. Be cautious with sun exposure and use good sun protection while on this medication. Pregnant women should not take this medication.   Lentigines - Scattered tan macules - Due to sun exposure - Benign-appearing, observe - Recommend daily broad spectrum sunscreen SPF 30+ to sun-exposed areas, reapply every 2 hours as needed. - Call for any changes  Seborrheic Keratoses - Stuck-on, waxy, tan-brown papules and/or plaques  - Benign-appearing - Discussed benign etiology and prognosis. - Observe - Call for any changes  Melanocytic Nevi - Tan-brown and/or pink-flesh-colored symmetric macules and papules - Benign  appearing on exam today - Observation - Call clinic for new or changing moles - Recommend daily use of broad spectrum spf 30+ sunscreen to sun-exposed areas.   Hemangiomas - Red papules - Discussed benign nature - Observe - Call for any changes  Actinic Damage - Chronic  condition, secondary to cumulative UV/sun exposure - diffuse scaly erythematous macules with underlying dyspigmentation - Recommend daily broad spectrum sunscreen SPF 30+ to sun-exposed areas, reapply every 2 hours as needed.  - Staying in the shade or wearing long sleeves, sun glasses (UVA+UVB protection) and wide brim hats (4-inch brim around the entire circumference of the hat) are also recommended for sun protection.  - Call for new or changing lesions.  Skin cancer screening performed today.  Return in about 6 months (around 03/20/2023) for Ak follow up .  [name], CMA, am acting as scribe for Sarina Ser, MD . Documentation: I have reviewed the above documentation for accuracy and completeness, and I agree with the above.  Sarina Ser, MD

## 2022-10-04 ENCOUNTER — Encounter: Payer: Self-pay | Admitting: Dermatology

## 2022-10-16 ENCOUNTER — Ambulatory Visit
Admission: EM | Admit: 2022-10-16 | Discharge: 2022-10-16 | Disposition: A | Discharge status: Home / Self Care | Payer: Medicare Other | Attending: Emergency Medicine | Admitting: Emergency Medicine

## 2022-10-16 DIAGNOSIS — H9201 Otalgia, right ear: Secondary | ICD-10-CM

## 2022-10-16 MED ORDER — NEOMYCIN-POLYMYXIN-HC 3.5-10000-1 OT SUSP: 4.0000 [drp] | Freq: Four times a day (QID) | OTIC | 0 refills | Status: AC

## 2022-10-16 NOTE — Discharge Instructions (Signed)
You do not have an internal ear infection, nor does this appear to be your temporomandibular or jaw joint.  He may have a mild otitis externa/irritation of your ear canal from cleaning it daily.  Try a few drops of 50-50 mixture of rubbing alcohol and vinegar in your ear after bathing.  If this does not improve your symptoms, then try the Cortisporin eardrops.

## 2022-10-16 NOTE — ED Triage Notes (Signed)
Pt c/o right ear discomfort [phone]

## 2022-10-16 NOTE — ED Provider Notes (Signed)
HPI  SUBJECTIVE:  Matthew Sheppard is a 71 y.o. male who presents with days of right ear discomfort described as pressure, slight pain.  He has been cleaning his ears daily with a Q-tip.  He reports that he grinds his teeth and wears his nightguard consistently.  No nasal congestion, fevers, URI symptoms, change in hearing, otorrhea, recent swimming.  He reports, ear popping when he chews.  Does not change with bathing and showering.  He has cleaning his ears out with improvement in his symptoms.  No aggravating factors.  He has a past medical history of hypercholesterolemia.  No history of diabetes, TMJ arthralgia.  [facility].    Past Medical History:  Diagnosis Date   Actinic keratosis 05/21/2007   Right cheek.    Actinic keratosis 06/24/2014   Mid to distal dorsum nose.    Headache    Hyperlipemia    IBS (irritable bowel syndrome)    Trigeminal autonomic cephalgias     Past Surgical History:  Procedure Laterality Date   BACK SURGERY     COLONOSCOPY WITH PROPOFOL N/A 06/28/2015   Procedure: COLONOSCOPY WITH PROPOFOL;  Surgeon: Lollie Sails, MD;  Location: [facility] ENDOSCOPY;  Service: Endoscopy;  Laterality: N/A;   COLONOSCOPY WITH PROPOFOL N/A 06/13/2021   Procedure: COLONOSCOPY WITH PROPOFOL;  Surgeon: Lesly Rubenstein, MD;  Location: [facility] ENDOSCOPY;  Service: Endoscopy;  Laterality: N/A;  Requesting 2nd or 3rd case   KNEE ARTHROCENTESIS      History reviewed. No pertinent family history.  Social History   Tobacco Use   Smoking status: Never   Smokeless tobacco: Never  Vaping Use   Vaping Use: Never used  Substance Use Topics   Alcohol use: Not Currently   Drug use: Never    No current facility-administered medications for this encounter.  Current Outpatient Medications:    aspirin EC 81 MG tablet, Take 81 mg by mouth daily., Disp: , Rfl:    carbamazepine (TEGRETOL) 200 MG tablet, Take 200 mg by mouth 3 (three) times daily., Disp: , Rfl:     Erenumab-aooe 140 MG/ML SOAJ, Inject into the skin every 28 (twenty-eight) days., Disp: , Rfl:    gabapentin (NEURONTIN) 800 MG tablet, TAKE 1 TABLET(800 MG) BY MOUTH THREE TIMES DAILY, Disp: , Rfl:    hyoscyamine (ANASPAZ) 0.125 MG TBDP disintergrating tablet, Place 0.125 mg under the tongue., Disp: , Rfl:    indomethacin (INDOCIN) 50 MG capsule, Take 50 mg by mouth 2 (two) times daily with a meal., Disp: , Rfl:    Multiple Vitamin (MULTIVITAMIN) tablet, Take 1 tablet by mouth daily., Disp: , Rfl:    mupirocin ointment (BACTROBAN) 2 %, Apply to wound QD., Disp: 22 g, Rfl: 0   neomycin-polymyxin-hydrocortisone (CORTISPORIN) 3.5-10000-1 OTIC suspension, Place 4 drops into the right ear 4 (four) times daily for 7 days., Disp: 5.6 mL, Rfl: 0   pravastatin (PRAVACHOL) 10 MG tablet, Take 10 mg by mouth daily., Disp: , Rfl:    psyllium (HYDROCIL/METAMUCIL) 95 % PACK, Take 1 packet by mouth daily., Disp: , Rfl:    rizatriptan (MAXALT) 10 MG tablet, Take 10 mg by mouth as needed for migraine. May repeat in 2 hours if needed, Disp: , Rfl:    Ruxolitinib Phosphate (OPZELURA) 1.5 % CREA, Apply 1 application topically 2 (two) times a week., Disp: 60 g, Rfl: 6   tobramycin (TOBREX) 0.3 % ophthalmic solution, APPLY TO AFFECTED FINGERNAILS EVERY NIGHT AT BEDTIME, Disp: 5 mL, Rfl: 2  triamcinolone cream (KENALOG) 0.1 %, APPLY SMALL AMOUNT EXTERNALLY TO THE AFFECTED AREA TWICE DAILY AS NEEDED. AVOID FACE, GROIN, UNDERARMS, Disp: 80 g, Rfl: 0  Allergies  Allergen Reactions   Cephalexin    Doxycycline Rash   Septra [Sulfamethoxazole-Trimethoprim] Rash     ROS  As noted in HPI.   Physical Exam  BP 133/81 (BP Location: Left Arm)   Pulse 83   Temp 98.3 F (36.8 C) (Oral)   Resp 18   Ht '5\' 9"'$  (1.753 m)   Wt 74.8 kg   SpO2 97%   BMI 24.37 kg/m   Constitutional: Well developed, well nourished, no acute distress Eyes:  EOMI, conjunctiva normal bilaterally HENT: Normocephalic, atraumatic,mucus  membranes moist.  Right external ear, EAC, TM normal.  TM intact.  No fluid levels, signs of infection behind the right TM.  No pain with traction on pinna, palpation of tragus or mastoid.  No tenderness along the TMJ bilaterally.  Positive crepitus on the right TMJ.  Hearing intact and equal bilaterally. Neck: No cervical lymphadenopathy Respiratory: Normal inspiratory effort Cardiovascular: Normal rate GI: nondistended skin: No rash, skin intact Musculoskeletal: no deformities Neurologic: Alert & oriented x 3, no focal neuro deficits Psychiatric: Speech and behavior appropriate   ED Course   Medications - No data to display  No orders of the defined types were placed in this encounter.   No results found for this or any previous visit (from the past 24 hour(s)). No results found.  ED Clinical Impression  1. Acute otalgia, right      ED Assessment/Plan     Patient does not have an otitis media.  Is not appear to be used TMJ.  Wonder if he could have a mild otitis externa/irritation from cleaning his ear daily with Q-tips.  Discussed with patient that he does not have cerumen impaction.  Advise 50-50 mixture of alcohol/vinegar after bathing and we can send home with a Polytrim wait-and-see prescription.  Follow-up with PCP as needed.   Discussed MDM, treatment plan, and plan for follow-up with patientpatient agrees with plan.   Meds ordered this encounter  Medications   neomycin-polymyxin-hydrocortisone (CORTISPORIN) 3.5-10000-1 OTIC suspension    Sig: Place 4 drops into the right ear 4 (four) times daily for 7 days.    Dispense:  5.6 mL    Refill:  0      *This clinic note was created using Lobby[employer]. Therefore, there may be occasional mistakes despite careful proofreading.  ?    Melynda Ripple, MD 10/16/22 0830

## 2022-12-24 ENCOUNTER — Other Ambulatory Visit: Payer: Self-pay

## 2022-12-24 DIAGNOSIS — L719 Rosacea, unspecified: Secondary | ICD-10-CM

## 2022-12-24 NOTE — Progress Notes (Signed)
ERROR

## 2023-01-14 ENCOUNTER — Other Ambulatory Visit: Payer: Self-pay

## 2023-01-14 MED ORDER — DOXYCYCLINE HYCLATE 50 MG PO CAPS: 50.0000 mg | ORAL_CAPSULE | Freq: Every day | ORAL | 2 refills | Status: DC

## 2023-01-14 NOTE — Progress Notes (Signed)
Patient left msg and pharmacy sent request for refill. aw

## 2023-01-17 ENCOUNTER — Telehealth: Payer: Self-pay

## 2023-01-17 MED ORDER — DOXYCYCLINE MONOHYDRATE 50 MG PO TABS: 50.0000 mg | ORAL_TABLET | Freq: Every day | ORAL | 2 refills | Status: DC

## 2023-01-17 NOTE — Telephone Encounter (Signed)
[facility] called due to Doxycycline Hyclate not covered. Insurance prefers Doxycycline Monohydrate. RX sent in. aw

## 2023-04-03 ENCOUNTER — Encounter: Payer: Self-pay | Admitting: Dermatology

## 2023-04-03 ENCOUNTER — Ambulatory Visit: Payer: Medicare Other | Admitting: Dermatology

## 2023-04-03 VITALS — BP 125/62 | HR 89

## 2023-04-03 DIAGNOSIS — L578 Other skin changes due to chronic exposure to nonionizing radiation: Secondary | ICD-10-CM | POA: Diagnosis not present

## 2023-04-03 DIAGNOSIS — Z79899 Other long term (current) drug therapy: Secondary | ICD-10-CM

## 2023-04-03 DIAGNOSIS — Z1283 Encounter for screening for malignant neoplasm of skin: Secondary | ICD-10-CM | POA: Diagnosis not present

## 2023-04-03 DIAGNOSIS — X32XXXA Exposure to sunlight, initial encounter: Secondary | ICD-10-CM

## 2023-04-03 DIAGNOSIS — L814 Other melanin hyperpigmentation: Secondary | ICD-10-CM

## 2023-04-03 DIAGNOSIS — D1801 Hemangioma of skin and subcutaneous tissue: Secondary | ICD-10-CM

## 2023-04-03 DIAGNOSIS — W908XXA Exposure to other nonionizing radiation, initial encounter: Secondary | ICD-10-CM

## 2023-04-03 DIAGNOSIS — L570 Actinic keratosis: Secondary | ICD-10-CM | POA: Diagnosis not present

## 2023-04-03 DIAGNOSIS — Z792 Long term (current) use of antibiotics: Secondary | ICD-10-CM

## 2023-04-03 DIAGNOSIS — D229 Melanocytic nevi, unspecified: Secondary | ICD-10-CM

## 2023-04-03 DIAGNOSIS — L719 Rosacea, unspecified: Secondary | ICD-10-CM

## 2023-04-03 DIAGNOSIS — L821 Other seborrheic keratosis: Secondary | ICD-10-CM

## 2023-04-03 MED ORDER — DOXYCYCLINE MONOHYDRATE 50 MG PO TABS: 50.0000 mg | ORAL_TABLET | Freq: Every day | ORAL | 6 refills | Status: DC

## 2023-04-03 NOTE — Progress Notes (Signed)
Follow-Up Visit   Subjective  Matthew Sheppard is a 72 y.o. male who presents for the following: Skin Cancer Screening and Upper Body Skin Exam Some rough areas at forehead and nose  The patient presents for Upper Body Skin Exam (UBSE) for skin cancer screening and mole check. The patient has spots, moles and lesions to be evaluated, some may be new or changing and the patient has concerns that these could be cancer.  The following portions of the chart were reviewed this encounter and updated as appropriate: medications, allergies, medical history  Review of Systems:  No other skin or systemic complaints except as noted in HPI or Assessment and Plan.  Objective  Well appearing patient in no apparent distress; mood and affect are within normal limits.  All skin waist up examined. Relevant physical exam findings are noted in the Assessment and Plan.  scalp,  face, ears x 7 (7) Erythematous thin papules/macules with gritty scale.    Assessment & Plan  Rosacea Face Rosacea is a chronic progressive skin condition usually affecting the face of adults, causing redness and/or acne bumps. It is treatable but not curable. It sometimes affects the eyes (ocular rosacea) as well. It may respond to topical and/or systemic medication and can flare with stress, sun exposure, alcohol, exercise, topical steroids (including hydrocortisone/cortisone 10) and some foods.  Daily application of broad spectrum spf 30+ sunscreen to face is recommended to reduce flares.   Continue Doxycycline 50mg  QD. Doxycycline should be taken with food to prevent nausea. Do not lay down for 30 minutes after taking. Be cautious with sun exposure and use good sun protection while on this medication. Pregnant women should not take this medication.   Actinic keratosis (7) scalp,  face, ears x 7  Actinic keratoses are precancerous spots that appear secondary to cumulative UV radiation exposure/sun exposure over time. They are  chronic with expected duration over 1 year. A portion of actinic keratoses will progress to squamous cell carcinoma of the skin. It is not possible to reliably predict which spots will progress to skin cancer and so treatment is recommended to prevent development of skin cancer.  Recommend daily broad spectrum sunscreen SPF 30+ to sun-exposed areas, reapply every 2 hours as needed.  Recommend staying in the shade or wearing long sleeves, sun glasses (UVA+UVB protection) and wide brim hats (4-inch brim around the entire circumference of the hat). Call for new or changing lesions.  Destruction of lesion - scalp,  face, ears x 7 Complexity: simple   Destruction method: cryotherapy   Informed consent: discussed and consent obtained   Timeout:  patient name, date of birth, surgical site, and procedure verified Lesion destroyed using liquid nitrogen: Yes   Region frozen until ice ball extended beyond lesion: Yes   Outcome: patient tolerated procedure well with no complications   Post-procedure details: wound care instructions given    Lentigines, Seborrheic Keratoses, Hemangiomas - Benign normal skin lesions - Benign-appearing - Call for any changes  Melanocytic Nevi - Tan-brown and/or pink-flesh-colored symmetric macules and papules - Benign appearing on exam today - Observation - Call clinic for new or changing moles - Recommend daily use of broad spectrum spf 30+ sunscreen to sun-exposed areas.   Actinic Damage - Chronic condition, secondary to cumulative UV/sun exposure - diffuse scaly erythematous macules with underlying dyspigmentation - Recommend daily broad spectrum sunscreen SPF 30+ to sun-exposed areas, reapply every 2 hours as needed.  - Staying in the shade or wearing long  sleeves, sun glasses (UVA+UVB protection) and wide brim hats (4-inch brim around the entire circumference of the hat) are also recommended for sun protection.  - Call for new or changing lesions.  Skin  cancer screening performed today.  Return in about 6 months (around 10/04/2023) for ubse .  IAsher Muir, CMA, am acting as scribe for Armida Sans, MD.  Documentation: I have reviewed the above documentation for accuracy and completeness, and I agree with the above.  Armida Sans, MD

## 2023-04-03 NOTE — Patient Instructions (Addendum)
Actinic keratoses are precancerous spots that appear secondary to cumulative UV radiation exposure/sun exposure over time. They are chronic with expected duration over 1 year. A portion of actinic keratoses will progress to squamous cell carcinoma of the skin. It is not possible to reliably predict which spots will progress to skin cancer and so treatment is recommended to prevent development of skin cancer.  Recommend daily broad spectrum sunscreen SPF 30+ to sun-exposed areas, reapply every 2 hours as needed.  Recommend staying in the shade or wearing long sleeves, sun glasses (UVA+UVB protection) and wide brim hats (4-inch brim around the entire circumference of the hat). Call for new or changing lesions.   Cryotherapy Aftercare  Wash gently with soap and water everyday.   Apply Vaseline and Band-Aid daily until healed.     Doxycycline should be taken with food to prevent nausea. Do not lay down for 30 minutes after taking. Be cautious with sun exposure and use good sun protection while on this medication. Pregnant women should not take this medication.    Melanoma ABCDEs  Melanoma is the most dangerous type of skin cancer, and is the leading cause of death from skin disease.  You are more likely to develop melanoma if you: Have light-colored skin, light-colored eyes, or red or blond hair Spend a lot of time in the sun Tan regularly, either outdoors or in a tanning bed Have had blistering sunburns, especially during childhood Have a close family member who has had a melanoma Have atypical moles or large birthmarks  Early detection of melanoma is key since treatment is typically straightforward and cure rates are extremely high if we catch it early.   The first sign of melanoma is often a change in a mole or a new dark spot.  The ABCDE system is a way of remembering the signs of melanoma.  A for asymmetry:  The two halves do not match. B for border:  The edges of the growth are  irregular. C for color:  A mixture of colors are present instead of an even brown color. D for diameter:  Melanomas are usually (but not always) greater than 6mm - the size of a pencil eraser. E for evolution:  The spot keeps changing in size, shape, and color.  Please check your skin once per month between visits. You can use a small mirror in front and a large mirror behind you to keep an eye on the back side or your body.   If you see any new or changing lesions before your next follow-up, please call to schedule a visit.  Please continue daily skin protection including broad spectrum sunscreen SPF 30+ to sun-exposed areas, reapplying every 2 hours as needed when you're outdoors.   Staying in the shade or wearing long sleeves, sun glasses (UVA+UVB protection) and wide brim hats (4-inch brim around the entire circumference of the hat) are also recommended for sun protection.           Due to recent changes in healthcare laws, you may see results of your pathology and/or laboratory studies on MyChart before the doctors have had a chance to review them. We understand that in some cases there may be results that are confusing or concerning to you. Please understand that not all results are received at the same time and often the doctors may need to interpret multiple results in order to provide you with the best plan of care or course of treatment. Therefore, we ask that you  please give [place] 2 business days to thoroughly review all your results before contacting the office for clarification. Should we see a critical lab result, you will be contacted sooner.   If You Need Anything After Your Visit  If you have any questions or concerns for your doctor, please call our main line at [phone] and press option 4 to reach your doctor's medical assistant. If no one answers, please leave a voicemail as directed and we will return your call as soon as possible. Messages left after 4 pm will be  answered the following business day.   You may also send [place] a message via MyChart. We typically respond to MyChart messages within 1-2 business days.  For prescription refills, please ask your pharmacy to contact our office. Our fax number is [phone].  If you have an urgent issue when the clinic is closed that cannot wait until the next business day, you can page your doctor at the number below.    Please note that while we do our best to be available for urgent issues outside of office hours, we are not available 24/7.   If you have an urgent issue and are unable to reach [place], you may choose to seek medical care at your doctor's office, retail clinic, urgent care center, or emergency room.  If you have a medical emergency, please immediately call 911 or go to the emergency department.  Pager Numbers  - Dr. Gwen Pounds: [phone]  - Dr. Neale Burly: [phone]  - Dr. Roseanne Reno: [phone]  In the event of inclement weather, please call our main line at [phone] for an update on the status of any delays or closures.  Dermatology Medication Tips: Please keep the boxes that topical medications come in in order to help keep track of the instructions about where and how to use these. Pharmacies typically print the medication instructions only on the boxes and not directly on the medication tubes.   If your medication is too expensive, please contact our office at [phone] option 4 or send [place] a message through MyChart.   We are unable to tell what your co-pay for medications will be in advance as this is different depending on your insurance coverage. However, we may be able to find a substitute medication at lower cost or fill out paperwork to get insurance to cover a needed medication.   If a prior authorization is required to get your medication covered by your insurance company, please allow [place] 1-2 business days to complete this process.  Drug prices often vary depending on  where the prescription is filled and some pharmacies may offer cheaper prices.  The website www.goodrx.com contains coupons for medications through different pharmacies. The prices here do not account for what the cost may be with help from insurance (it may be cheaper with your insurance), but the website can give you the price if you did not use any insurance.  - You can print the associated coupon and take it with your prescription to the pharmacy.  - You may also stop by our office during regular business hours and pick up a GoodRx coupon card.  - If you need your prescription sent electronically to a different pharmacy, notify our office through [facility] or by phone at [phone] option 4.     Si Usted Necesita Algo Despus de Su Visita  Tambin puede enviarnos un mensaje a travs de Clinical cytogeneticist. Por lo general respondemos a los mensajes de MyChart en el transcurso de  1 a 2 das hbiles.  Para renovar recetas, por favor pida a su farmacia que se ponga en contacto con nuestra oficina. Annie Sable de fax es [place] [phone].  Si tiene un asunto urgente cuando la clnica est cerrada y que no puede esperar hasta el siguiente da hbil, puede llamar/localizar a su doctor(a) al nmero que aparece a continuacin.   Por favor, tenga en cuenta que aunque hacemos todo lo posible para estar disponibles para asuntos urgentes fuera del horario de [place], no estamos disponibles las 24 horas del da, los 7 [address]  [address] de la [place].   Si tiene un problema urgente y no puede comunicarse con nosotros, puede optar por buscar atencin mdica  en el consultorio de su doctor(a), en una clnica privada, en un centro de atencin urgente o en una sala de emergencias.  Si tiene Engineer, drilling, por favor llame inmediatamente al 911 o vaya a la sala de emergencias.  Nmeros de bper  - Dr. Gwen Pounds: [phone]  - Dra. Moye: [phone]  - Dra. Roseanne Reno: [phone]  En caso de inclemencias  del [place], por favor llame a Lacy Duverney principal al [phone] para una actualizacin sobre el [place] de cualquier retraso o cierre.  Consejos para la medicacin en dermatologa: Por favor, guarde las cajas en las que vienen los medicamentos de uso tpico para ayudarle a seguir las instrucciones sobre dnde y cmo usarlos. Las farmacias generalmente imprimen las instrucciones del medicamento slo en las cajas y no directamente en los tubos del [place].   Si su medicamento es muy caro, por favor, pngase en contacto con Rolm Gala llamando al [phone] y presione la opcin 4 o envenos un mensaje a travs de Clinical cytogeneticist.   No podemos decirle cul ser su copago por los medicamentos por adelantado ya que esto es diferente dependiendo de la cobertura de su seguro. Sin embargo, es posible que podamos encontrar un medicamento sustituto a Audiological scientist un formulario para que el seguro cubra el medicamento que se considera necesario.   Si se requiere una autorizacin previa para que su compaa de seguros [place] su medicamento, por favor permtanos de 1 a 2 das hbiles para completar [address]  Los precios de los medicamentos varan con frecuencia dependiendo del Environmental consultant de dnde se surte la receta y alguna farmacias pueden ofrecer precios ms baratos.  El sitio web www.goodrx.com tiene cupones para medicamentos de Health and safety inspector. Los precios aqu no tienen en cuenta lo que podra costar con la ayuda del seguro (puede ser ms barato con su seguro), pero el sitio web puede darle el precio si no utiliz Tourist information centre manager.  - Puede imprimir el cupn correspondiente y llevarlo con su receta a la farmacia.  - Tambin puede pasar por nuestra oficina durante el horario de atencin regular y Education officer, museum una tarjeta de cupones de GoodRx.  - Si necesita que su receta se enve electrnicamente a una farmacia diferente, informe a nuestra oficina a travs de MyChart de [place] o por telfono  llamando al [phone] y presione la opcin 4.

## 2023-04-10 ENCOUNTER — Encounter: Payer: Self-pay | Admitting: Dermatology

## 2023-09-25 ENCOUNTER — Ambulatory Visit: Payer: Medicare Other | Admitting: Dermatology

## 2023-09-25 DIAGNOSIS — Z1283 Encounter for screening for malignant neoplasm of skin: Secondary | ICD-10-CM

## 2023-09-25 DIAGNOSIS — D229 Melanocytic nevi, unspecified: Secondary | ICD-10-CM

## 2023-09-25 DIAGNOSIS — W908XXA Exposure to other nonionizing radiation, initial encounter: Secondary | ICD-10-CM | POA: Diagnosis not present

## 2023-09-25 DIAGNOSIS — Z79899 Other long term (current) drug therapy: Secondary | ICD-10-CM

## 2023-09-25 DIAGNOSIS — L821 Other seborrheic keratosis: Secondary | ICD-10-CM

## 2023-09-25 DIAGNOSIS — D1801 Hemangioma of skin and subcutaneous tissue: Secondary | ICD-10-CM

## 2023-09-25 DIAGNOSIS — L719 Rosacea, unspecified: Secondary | ICD-10-CM

## 2023-09-25 DIAGNOSIS — Z7189 Other specified counseling: Secondary | ICD-10-CM

## 2023-09-25 DIAGNOSIS — L814 Other melanin hyperpigmentation: Secondary | ICD-10-CM

## 2023-09-25 DIAGNOSIS — L570 Actinic keratosis: Secondary | ICD-10-CM | POA: Diagnosis not present

## 2023-09-25 DIAGNOSIS — L578 Other skin changes due to chronic exposure to nonionizing radiation: Secondary | ICD-10-CM

## 2023-09-25 NOTE — Progress Notes (Signed)
Follow-Up Visit   Subjective  COLON DELAOSSA is a 72 y.o. male who presents for the following: Skin Cancer Screening and Upper Body Skin Exam  The patient presents for Upper Body Skin Exam (UBSE) for skin cancer screening and mole check. The patient has spots, moles and lesions to be evaluated, some may be new or changing and the patient may have concern these could be cancer.    The following portions of the chart were reviewed this encounter and updated as appropriate: medications, allergies, medical history  Review of Systems:  No other skin or systemic complaints except as noted in HPI or Assessment and Plan.  Objective  Well appearing patient in no apparent distress; mood and affect are within normal limits.  All skin waist up examined. Relevant physical exam findings are noted in the Assessment and Plan.  L ear x 2 (2) Erythematous thin papules/macules with gritty scale.     Assessment & Plan   Actinic keratosis (2) L ear x 2  Actinic keratoses are precancerous spots that appear secondary to cumulative UV radiation exposure/sun exposure over time. They are chronic with expected duration over 1 year. A portion of actinic keratoses will progress to squamous cell carcinoma of the skin. It is not possible to reliably predict which spots will progress to skin cancer and so treatment is recommended to prevent development of skin cancer.  Recommend daily broad spectrum sunscreen SPF 30+ to sun-exposed areas, reapply every 2 hours as needed.  Recommend staying in the shade or wearing long sleeves, sun glasses (UVA+UVB protection) and wide brim hats (4-inch brim around the entire circumference of the hat). Call for new or changing lesions.  Destruction of lesion - L ear x 2 (2) Complexity: simple   Destruction method: cryotherapy   Informed consent: discussed and consent obtained   Timeout:  patient name, date of birth, surgical site, and procedure verified Lesion destroyed  using liquid nitrogen: Yes   Region frozen until ice ball extended beyond lesion: Yes   Outcome: patient tolerated procedure well with no complications   Post-procedure details: wound care instructions given     Skin cancer screening performed today.  Actinic Damage - Chronic condition, secondary to cumulative UV/sun exposure - diffuse scaly erythematous macules with underlying dyspigmentation - Recommend daily broad spectrum sunscreen SPF 30+ to sun-exposed areas, reapply every 2 hours as needed.  - Staying in the shade or wearing long sleeves, sun glasses (UVA+UVB protection) and wide brim hats (4-inch brim around the entire circumference of the hat) are also recommended for sun protection.  - Call for new or changing lesions.  Lentigines, Seborrheic Keratoses, Hemangiomas - Benign normal skin lesions - Benign-appearing - Call for any changes  Melanocytic Nevi - Tan-brown and/or pink-flesh-colored symmetric macules and papules - Benign appearing on exam today - Observation - Call clinic for new or changing moles - Recommend daily use of broad spectrum spf 30+ sunscreen to sun-exposed areas.   Rosacea Face Rosacea is a chronic progressive skin condition usually affecting the face of adults, causing redness and/or acne bumps. It is treatable but not curable. It sometimes affects the eyes (ocular rosacea) as well. It may respond to topical and/or systemic medication and can flare with stress, sun exposure, alcohol, exercise, topical steroids (including hydrocortisone/cortisone 10) and some foods.  Daily application of broad spectrum spf 30+ sunscreen to face is recommended to reduce flares.   Continue Doxycycline 50mg  QD. Doxycycline should be taken with food to prevent nausea.  Do not lay down for 30 minutes after taking. Be cautious with sun exposure and use good sun protection while on this medication. Pregnant women should not take this medication.  Return in about 6 months (around  03/24/2024) for UBSE.  Maylene Roes, CMA, am acting as scribe for Armida Sans, MD .   Documentation: I have reviewed the above documentation for accuracy and completeness, and I agree with the above.  Armida Sans, MD

## 2023-09-25 NOTE — Patient Instructions (Signed)

## 2023-10-01 ENCOUNTER — Encounter: Payer: Self-pay | Admitting: Dermatology

## 2023-11-29 ENCOUNTER — Other Ambulatory Visit: Payer: Self-pay | Admitting: Dermatology

## 2024-04-16 ENCOUNTER — Encounter: Payer: Self-pay | Admitting: Dermatology

## 2024-04-16 ENCOUNTER — Ambulatory Visit: Payer: Medicare Other | Admitting: Dermatology

## 2024-04-16 DIAGNOSIS — D1801 Hemangioma of skin and subcutaneous tissue: Secondary | ICD-10-CM

## 2024-04-16 DIAGNOSIS — W908XXA Exposure to other nonionizing radiation, initial encounter: Secondary | ICD-10-CM | POA: Diagnosis not present

## 2024-04-16 DIAGNOSIS — D229 Melanocytic nevi, unspecified: Secondary | ICD-10-CM

## 2024-04-16 DIAGNOSIS — L814 Other melanin hyperpigmentation: Secondary | ICD-10-CM

## 2024-04-16 DIAGNOSIS — L570 Actinic keratosis: Secondary | ICD-10-CM

## 2024-04-16 DIAGNOSIS — L578 Other skin changes due to chronic exposure to nonionizing radiation: Secondary | ICD-10-CM

## 2024-04-16 DIAGNOSIS — L719 Rosacea, unspecified: Secondary | ICD-10-CM

## 2024-04-16 DIAGNOSIS — L700 Acne vulgaris: Secondary | ICD-10-CM

## 2024-04-16 DIAGNOSIS — L709 Acne, unspecified: Secondary | ICD-10-CM | POA: Diagnosis not present

## 2024-04-16 DIAGNOSIS — D692 Other nonthrombocytopenic purpura: Secondary | ICD-10-CM

## 2024-04-16 DIAGNOSIS — Z1283 Encounter for screening for malignant neoplasm of skin: Secondary | ICD-10-CM | POA: Diagnosis not present

## 2024-04-16 DIAGNOSIS — Z7189 Other specified counseling: Secondary | ICD-10-CM

## 2024-04-16 DIAGNOSIS — Z79899 Other long term (current) drug therapy: Secondary | ICD-10-CM

## 2024-04-16 DIAGNOSIS — L821 Other seborrheic keratosis: Secondary | ICD-10-CM

## 2024-04-16 DIAGNOSIS — L711 Rhinophyma: Secondary | ICD-10-CM

## 2024-04-16 MED ORDER — DOXYCYCLINE HYCLATE 50 MG PO CAPS: 50.0000 mg | ORAL_CAPSULE | Freq: Every day | ORAL | 5 refills | Status: DC

## 2024-04-16 MED ORDER — TRETINOIN 0.025 % EX CREA: TOPICAL_CREAM | CUTANEOUS | 6 refills | Status: AC

## 2024-04-16 NOTE — Patient Instructions (Signed)

## 2024-04-16 NOTE — Progress Notes (Signed)
 Follow-Up Visit   Subjective  Matthew Sheppard is a 73 y.o. male who presents for the following: Skin Cancer Screening and Upper Body Skin Exam  The patient presents for Upper Body Skin Exam (UBSE) for skin cancer screening and mole check. The patient has spots, moles and lesions to be evaluated, some may be new or changing and the patient may have concern these could be cancer.  The following portions of the chart were reviewed this encounter and updated as appropriate: medications, allergies, medical history  Review of Systems:  No other skin or systemic complaints except as noted in HPI or Assessment and Plan.  Objective  Well appearing patient in no apparent distress; mood and affect are within normal limits.  All skin waist up examined. Relevant physical exam findings are noted in the Assessment and Plan.  Face and ears x 7 (7) Erythematous thin papules/macules with gritty scale.   Assessment & Plan   ACTINIC KERATOSIS (7) Face and ears x 7 (7) Actinic keratoses are precancerous spots that appear secondary to cumulative UV radiation exposure/sun exposure over time. They are chronic with expected duration over 1 year. A portion of actinic keratoses will progress to squamous cell carcinoma of the skin. It is not possible to reliably predict which spots will progress to skin cancer and so treatment is recommended to prevent development of skin cancer.  Recommend daily broad spectrum sunscreen SPF 30+ to sun-exposed areas, reapply every 2 hours as needed.  Recommend staying in the shade or wearing long sleeves, sun glasses (UVA+UVB protection) and wide brim hats (4-inch brim around the entire circumference of the hat). Call for new or changing lesions. 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 It is treatable but not curable. It sometimes affects the eyes (ocular rosacea) as well. It may respond to topical and/or systemic medication and can flare with stress, sun exposure, alcohol, exercise, topical steroids (including hydrocortisone/cortisone 10) and some foods.  Daily application of broad spectrum spf 30+ sunscreen to face is recommended to reduce flares.   Continue Doxycycline  50mg  QD. Doxycycline  should be taken with food to prevent nausea. Do not lay down for 30 minutes after taking. Be cautious with sun exposure and use good sun protection while on this medication. Pregnant women should not take this medication.  Start Tretinoin 0.025% cream QHS. Topical retinoid medications like tretinoin/Retin-A, adapalene/Differin, tazarotene/Fabior, and Epiduo/Epiduo Forte can cause dryness and irritation when first started. Only apply a pea-sized amount to the entire affected area. Avoid applying it around the eyes, edges of mouth and creases at the nose. If you experience irritation, use a good moisturizer first and/or apply the medicine less often. If you are doing well with the medicine, you can increase how often you use it until you are applying every night. Be careful with sun protection while using this medication as it  can make you sensitive to the sun. This medicine should not be used by pregnant women.  Related Medications tretinoin (RETIN-A) 0.025 % cream Apply a thin coat to the nose and forehead  QHS.  Actinic Damage - Chronic condition, secondary to cumulative UV/sun exposure - diffuse scaly erythematous macules with underlying dyspigmentation - Recommend daily broad spectrum sunscreen SPF 30+ to sun-exposed areas, reapply every 2 hours as needed.  - Staying in the shade or wearing long sleeves, sun glasses (UVA+UVB protection) and wide brim hats (4-inch brim around the entire circumference of the hat) are also recommended for sun protection.  - Call for new or changing lesions.  Lentigines, Seborrheic Keratoses, Hemangiomas - Benign normal skin lesions - Benign-appearing - Call for any changes  Melanocytic Nevi - Tan-brown and/or pink-flesh-colored symmetric macules and papules - Benign appearing on exam today - Observation - Call clinic for new or changing moles - Recommend daily use of broad spectrum spf 30+ sunscreen to sun-exposed areas.   Purpura - Chronic; persistent and recurrent.  Treatable, but not curable. - Violaceous macules and patches - Benign - Related to trauma, age, sun damage and/or use of blood thinners, chronic use of topical and/or oral steroids - Observe - Can use OTC arnica containing moisturizer such as Dermend Bruise Formula if desired - Call for worsening or other concerns  Skin cancer screening performed today.  Return for UBSE in 7-8 mths - hx AK.  Arlinda Lais, CMA, am acting as scribe for Celine Collard, MD .   Documentation: I have reviewed the above documentation for accuracy and completeness, and I agree with the above.  Celine Collard, MD

## 2024-04-20 ENCOUNTER — Telehealth: Payer: Self-pay

## 2024-04-20 MED ORDER — DOXYCYCLINE MONOHYDRATE 50 MG PO CAPS: 50.0000 mg | ORAL_CAPSULE | Freq: Every day | ORAL | 2 refills | Status: DC

## 2024-04-20 NOTE — Telephone Encounter (Signed)
 Patient called to let us  know he can can not take Doxycycline  hyclate, he can only take Doxycycline  Monohydrate. Okay Doxycycline  Monohydrate called into the pharmacy

## 2024-05-07 ENCOUNTER — Telehealth: Payer: Self-pay

## 2024-05-07 NOTE — Telephone Encounter (Signed)
 Patient called about the recent acne flares he has been having and discussed at last appointment. Patient feels as if these flares come after eating chocolate or tomato based products. He did have to pay out of pocket for the Tretinoin  and can not afford that every month. He also confirms taking his Doxycycline  50mg  daily. He is asking if you can prescribe anything additional in a oral form to help?

## 2024-05-11 MED ORDER — DOXYCYCLINE MONOHYDRATE 100 MG PO CAPS: 100.0000 mg | ORAL_CAPSULE | Freq: Two times a day (BID) | ORAL | 3 refills | Status: DC

## 2024-05-11 NOTE — Telephone Encounter (Signed)
 Patient advised of information per Dr. Hester and RX sent in. Patient states he is unsure if he will try/use Differin and really upset about the foods causing these flares. aw

## 2024-08-22 ENCOUNTER — Encounter: Payer: Self-pay | Admitting: Emergency Medicine

## 2024-08-22 ENCOUNTER — Ambulatory Visit
Admission: EM | Admit: 2024-08-22 | Discharge: 2024-08-22 | Disposition: A | Discharge status: Home / Self Care | Attending: Student | Admitting: Student

## 2024-08-22 DIAGNOSIS — L0103 Bullous impetigo: Secondary | ICD-10-CM

## 2024-08-22 MED ORDER — AMOXICILLIN-POT CLAVULANATE 875-125 MG PO TABS: 1.0000 | ORAL_TABLET | Freq: Two times a day (BID) | ORAL | 0 refills | Status: AC

## 2024-08-22 NOTE — Discharge Instructions (Addendum)
-  Start the antibiotic-Augmentin (amoxicillin-clavulanate), 1 pill every 12 hours for 7 days.  You can take this with food like with breakfast and dinner. -Keep your hands clean.  You can wrap your hands with a bandage - Schedule a follow-up with your dermatologist to make sure things are getting better.

## 2024-08-22 NOTE — ED Triage Notes (Signed)
 Patient reports last Saturday, he started having redness, swelling and itching on both hands. Patient states that he was seen last Sunday, and was started on a steroid cream.  Patient reports increasing pain and large blisters on both hands.  Patient has been taking Benadryl daily.

## 2024-08-22 NOTE — ED Provider Notes (Signed)
 [facility]    CSN: [id number] Arrival date & time: 08/22/24  1033      History   Chief Complaint Chief Complaint  Patient presents with   Blister    Bilateral hands   Rash    HPI Matthew Sheppard is a 73 y.o. male presenting for follow-up for irritant dermatitis of bilateral hands.  [facility]24, and they prescribed him triamcinolone 0.5% ointment, which he tried, but the symptoms only got worse. Now with pain and blistering over his knuckles.   Per [name] note: Skin on the backs of both hands has been burning and itching for the past 5 or 6 days, especially when he wakes up in the morning and after washing his hands. The area is red and dry looking. No swelling, drainage, joint pain, fever, chills, nausea. 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 He denies any known exposure to poisonous plants or chemicals. He has started using a moisturizing cream in the last 2 days which seems to be helping. No new medications or medication changes.   HPI  Past Medical History:  Diagnosis Date   Actinic keratosis 05/21/2007   Right cheek.    Actinic keratosis 06/24/2014   Mid to distal dorsum nose.    Headache    Hyperlipemia    IBS (irritable bowel syndrome)    Trigeminal autonomic cephalgias     There are no active problems to display for this patient.   Past Surgical History:  Procedure Laterality Date   BACK SURGERY     COLONOSCOPY WITH PROPOFOL  N/A 06/28/2015   Procedure: COLONOSCOPY WITH PROPOFOL ;  Surgeon: Matthew RAYMOND Mariner, MD;  Location: [facility] ENDOSCOPY;  Service: Endoscopy;  Laterality: N/A;   COLONOSCOPY WITH PROPOFOL  N/A 06/13/2021   Procedure: COLONOSCOPY WITH PROPOFOL ;  Surgeon: Matthew Ole DASEN, MD;  Location: [facility] ENDOSCOPY;  Service: Endoscopy;  Laterality: N/A;  Requesting 2nd or 3rd case   KNEE ARTHROCENTESIS         Home Medications    Prior to Admission medications    Medication Sig Start Date End Date Taking? Authorizing Provider  amoxicillin-clavulanate (AUGMENTIN) 875-125 MG tablet Take 1 tablet by mouth every 12 (twelve) hours. 08/22/24  Yes Matthew Vanaman E, PA-C  triamcinolone cream (KENALOG) 0.1 % APPLY SMALL AMOUNT EXTERNALLY TO THE AFFECTED AREA TWICE DAILY AS NEEDED. AVOID FACE, GROIN, UNDERARMS 06/12/21  Yes Matthew Alm BROCKS, MD  aspirin EC 81 MG tablet Take 81 mg by mouth daily.    [provider]  carbamazepine (TEGRETOL) 200 MG tablet Take 200 mg by mouth 3 (three) times daily.    [provider]  Erenumab-aooe 140 MG/ML SOAJ Inject into the skin every 28 (twenty-eight) days.    [provider]  gabapentin (NEURONTIN) 800 MG tablet TAKE 1 TABLET(800 MG) BY MOUTH THREE TIMES DAILY 06/16/20   [provider]  hyoscyamine (ANASPAZ) 0.125 MG TBDP disintergrating tablet Place 0.125 mg under the tongue.    [provider]  indomethacin (INDOCIN) 50 MG capsule Take 50 mg by mouth 2 (two) times daily with a meal.    [provider]  Multiple Vitamin (MULTIVITAMIN) tablet Take 1 tablet by mouth daily.    [provider]  mupirocin  ointment (BACTROBAN ) 2 % Apply to wound QD. Patient not taking: Reported on 04/16/2024 07/31/22   Matthew Alm BROCKS, MD  pravastatin (PRAVACHOL) 10 MG tablet Take 10 mg  by mouth daily.    [provider]  psyllium (HYDROCIL/METAMUCIL) 95 % PACK Take 1 packet by mouth daily.    [provider]  rizatriptan (MAXALT) 10 MG tablet Take 10 mg by mouth as needed for migraine. May repeat in 2 hours if needed    [provider]  Ruxolitinib Phosphate  (OPZELURA ) 1.5 % CREA Apply 1 application topically 2 (two) times a week. 09/07/21   Matthew Alm BROCKS, MD  tobramycin  (TOBREX ) 0.3 % ophthalmic solution APPLY TO AFFECTED FINGERNAILS EVERY NIGHT AT BEDTIME 11/13/21   Matthew Alm BROCKS, MD  tretinoin  (RETIN-A ) 0.025 % cream Apply a thin coat to the nose and  forehead QHS. 04/16/24   Matthew Alm BROCKS, MD    Family History History reviewed. No pertinent family history.  Social History Social History   Tobacco Use   Smoking status: Never   Smokeless tobacco: Never  Vaping Use   Vaping status: Never Used  Substance Use Topics   Alcohol use: Not Currently   Drug use: Never     Allergies   Cephalexin, Doxycycline , and Septra [sulfamethoxazole-trimethoprim]   Review of Systems Review of Systems  Skin:  Positive for rash.     Physical Exam Triage Vital Signs ED Triage Vitals  Encounter Vitals Group     BP      Girls Systolic BP Percentile      Girls Diastolic BP Percentile      Boys Systolic BP Percentile      Boys Diastolic BP Percentile      Pulse      Resp      Temp      Temp src      SpO2      Weight      Height      Head Circumference      Peak Flow      Pain Score      Pain Loc      Pain Education      Exclude from Growth Chart    No data found.  Updated Vital Signs BP (P) 121/69 (BP Location: Right Arm)   Pulse (P) 70   Temp (P) 98.1 F (36.7 C) (Oral)   Resp (P) 15   Ht 5' 9 (1.753 m)   Wt 164 lb 14.5 oz (74.8 kg)   SpO2 (P) 95%   BMI 24.35 kg/m   Visual Acuity Right Eye Distance:   Left Eye Distance:   Bilateral Distance:    Right Eye Near:   Left Eye Near:    Bilateral Near:     Physical Exam Vitals reviewed.  Constitutional:      General: He is not in acute distress.    Appearance: Normal appearance. He is not ill-appearing.  HENT:     Head: Normocephalic and atraumatic.  Pulmonary:     Effort: Pulmonary effort is normal.  Skin:    Comments: See images below Left hand with 2 blisters overlying MCP joints 2-4, with underlying warmth and erythema. Cap refill < 2 seconds.   R hand with blister overlying MCP joint 3  Neurological:     General: No focal deficit present.     Mental Status: He is alert and oriented to person, place, and time.  Psychiatric:        Mood and Affect:  Mood normal.        Behavior: Behavior normal.        Thought Content: Thought content normal.  Judgment: Judgment normal.      L hand    R hand   UC Treatments / Results  Labs (all labs ordered are listed, but only abnormal results are displayed) Labs Reviewed - No data to display  EKG   Radiology No results found.  Procedures Procedures (including critical care time)  Medications Ordered in UC Medications - No data to display  Initial Impression / Assessment and Plan / UC Course  I have reviewed the triage vital signs and the nursing notes.  Pertinent labs & imaging results that were available during my care of the patient were reviewed by me and considered in my medical decision making (see chart for details).     This patient is a pleasant 73 year old male presenting with bullous impetigo.  Will manage with Augmentin, and dermatology follow-up.  He has a documented Keflex allergy, but has taken Augmentin in the past, and tolerates it well.  He also has a documented doxycycline  allergy, but is actually taking this already for another skin condition, and will continue that as directed as well.  Return precautions: Infection getting worse instead of better, worsening pain, new fevers, new drainage, etc.  Final Clinical Impressions(s) / UC Diagnoses   Final diagnoses:  Bullous impetigo     Discharge Instructions      -Start the antibiotic-Augmentin (amoxicillin-clavulanate), 1 pill every 12 hours for 7 days.  You can take this with food like with breakfast and dinner. -Keep your hands clean.  You can wrap your hands with a bandage - Schedule a follow-up with your dermatologist to make sure things are getting better.     ED Prescriptions     Medication Sig Dispense Auth. Provider   amoxicillin-clavulanate (AUGMENTIN) 875-125 MG tablet Take 1 tablet by mouth every 12 (twelve) hours. 14 tablet Relda Agosto E, PA-C      PDMP not reviewed this  encounter.   Arlyss Leita BRAVO, PA-C 08/22/24 1226

## 2024-08-25 ENCOUNTER — Ambulatory Visit

## 2024-08-25 ENCOUNTER — Other Ambulatory Visit: Payer: Self-pay

## 2024-08-25 DIAGNOSIS — R21 Rash and other nonspecific skin eruption: Secondary | ICD-10-CM | POA: Diagnosis not present

## 2024-08-25 MED ORDER — CLOBETASOL PROPIONATE 0.05 % EX OINT: TOPICAL_OINTMENT | CUTANEOUS | 5 refills | Status: AC

## 2024-08-25 NOTE — Patient Instructions (Addendum)
 Steroid Use  We prescribed you a topical steroid at today's visit.   General application instructions: -Apply this to any affected skin areas, twice (2 times) daily, for two (2) weeks -If the areas are better, you can stop -Re-start the topical steroid if the areas come back, or flare -If the areas don't get better after two weeks, we sometimes recommend taking a break for one (1) week, before restarting for another two (2) weeks. Repeat as needed  The most common side effects of topical steroid medications include changes in skin pigment and thinning of the skin. If the steroid is only applied to affected areas of the skin, these effects rarely occur unless the steroid is used for a very long time (years without stopping).   If we prescribed you a strong steroid, please avoid applying to face, groin, or neck, unless we tell you otherwise. We will include more detail in your prescription instructions.    Due to recent changes in healthcare laws, you may see results of your pathology and/or laboratory studies on MyChart before the doctors have had a chance to review them. We understand that in some cases there may be results that are confusing or concerning to you. Please understand that not all results are received at the same time and often the doctors may need to interpret multiple results in order to provide you with the best plan of care or course of treatment. Therefore, we ask that you please give us  2 business days to thoroughly review all your results before contacting the office for clarification. Should we see a critical lab result, you will be contacted sooner.   If You Need Anything After Your Visit  If you have any questions or concerns for your doctor, please call our main line at [phone] and press option 4 to reach your doctor's medical assistant. If no one answers, please leave a voicemail as directed and we will return your call as soon as possible. Messages left after  4 pm will be answered the following business day.   You may also send us  a message via MyChart. We typically respond to MyChart messages within 1-2 business days.  For prescription refills, please ask your pharmacy to contact our office. Our fax number is [phone].  If you have an urgent issue when the clinic is closed that cannot wait until the next business day, you can page your doctor at the number below.    Please note that while we do our best to be available for urgent issues outside of office hours, we are not available 24/7.   If you have an urgent issue and are unable to reach us , you may choose to seek medical care at your doctor's office, retail clinic, urgent care center, or emergency room.  If you have a medical emergency, please immediately call 911 or go to the emergency department.  Pager Numbers  - Dr. Hester: [phone]  - Dr. Jackquline: [phone]  - Dr. Claudene: [phone]   - Dr. Raymund: [phone]  In the event of inclement weather, please call our main line at [phone] for an update on the status of any delays or closures.  Dermatology Medication Tips: Please keep the boxes that topical medications come in in order to help keep track of the instructions about where and how to use these. Pharmacies typically print the medication instructions only on the boxes and not directly on the medication tubes.   If your medication is too expensive, please contact our  office at [phone] option 4 or send us  a message through MyChart.   We are unable to tell what your co-pay for medications will be in advance as this is different depending on your insurance coverage. However, we may be able to find a substitute medication at lower cost or fill out paperwork to get insurance to cover a needed medication.   If a prior authorization is required to get your medication covered by your insurance company, please allow us  1-2 business days to complete this  process.  Drug prices often vary depending on where the prescription is filled and some pharmacies may offer cheaper prices.  The website www.goodrx.com contains coupons for medications through different pharmacies. The prices here do not account for what the cost may be with help from insurance (it may be cheaper with your insurance), but the website can give you the price if you did not use any insurance.  - You can print the associated coupon and take it with your prescription to the pharmacy.  - You may also stop by our office during regular business hours and pick up a GoodRx coupon card.  - If you need your prescription sent electronically to a different pharmacy, notify our office through [facility] or by phone at [phone] option 4.     Si Usted Necesita Algo Despus de Su Visita  Tambin puede enviarnos un mensaje a travs de Clinical cytogeneticist. Por lo general respondemos a los mensajes de MyChart en el transcurso de 1 a 2 das hbiles.  Para renovar recetas, por favor pida a su farmacia que se ponga en contacto con nuestra oficina. Randi lakes de fax es [place] [phone].  Si tiene un asunto urgente cuando la clnica est cerrada y que no puede esperar hasta el siguiente da hbil, puede llamar/localizar a su doctor(a) al nmero que aparece a continuacin.   Por favor, tenga en cuenta que aunque hacemos todo lo posible para estar disponibles para asuntos urgentes fuera del horario de [place], no estamos disponibles las 24 horas del da, los 7 [address]  [address] de la [place].   Si tiene un problema urgente y no puede comunicarse con nosotros, puede optar por buscar atencin mdica  en el consultorio de su doctor(a), en una clnica privada, en un centro de atencin urgente o en una sala de emergencias.  Si tiene Engineer, drilling, por favor llame inmediatamente al 911 o vaya a la sala de emergencias.  Nmeros de bper  - Dr. Hester: [phone]  - Dra. Jackquline: [phone]  - Dr.  Claudene: [phone]  - Dra. Kitts: [phone]  En caso de inclemencias del [place], por favor llame a nuestra lnea principal al [phone] para una actualizacin sobre el estado de cualquier retraso o cierre.  Consejos para la medicacin en dermatologa: Por favor, guarde las cajas en las que vienen los medicamentos de uso tpico para ayudarle a seguir las instrucciones sobre dnde y cmo usarlos. Las farmacias generalmente imprimen las instrucciones del medicamento slo en las cajas y no directamente en los tubos del [place].   Si su medicamento es muy caro, por favor, pngase en contacto con landry rieger llamando al [phone] y presione la opcin 4 o envenos un mensaje a travs de Clinical cytogeneticist.   No podemos decirle cul ser su copago por los medicamentos por adelantado ya que esto es diferente dependiendo de la cobertura de su seguro. Sin embargo, es posible que podamos encontrar un medicamento sustituto a Audiological scientist un formulario para que el  seguro cubra el medicamento que se considera necesario.   Si se requiere una autorizacin previa para que su compaa de seguros malta su medicamento, por favor permtanos de 1 a 2 das hbiles para completar este proceso.  Los precios de los medicamentos varan con frecuencia dependiendo del Environmental consultant de dnde se surte la receta y alguna farmacias pueden ofrecer precios ms baratos.  El sitio web www.goodrx.com tiene cupones para medicamentos de Health and safety inspector. Los precios aqu no tienen en cuenta lo que podra costar con la ayuda del seguro (puede ser ms barato con su seguro), pero el sitio web puede darle el precio si no utiliz Tourist information centre manager.  - Puede imprimir el cupn correspondiente y llevarlo con su receta a la farmacia.  - Tambin puede pasar por nuestra oficina durante el horario de atencin regular y Education officer, museum una tarjeta de cupones de GoodRx.  - Si necesita que su receta se enve electrnicamente a una farmacia diferente,  informe a nuestra oficina a travs de MyChart de [place] o por telfono llamando al [phone] y presione la opcin 4.

## 2024-08-25 NOTE — Progress Notes (Signed)
    Subjective   Matthew Sheppard is a 73 y.o. male who presents for the following: Rash. Patient is established patient   Today patient reports: Rash on bilateral dorsal hands x2 weeks. Was treated with Triamcinolone ointment from urgent care while on a camping trip; he also went to urgent care in [place] last week and was dx'd w/bullous impetigo. He was given oral ABX that he is still taking he has d/c topical treatment.   States started after being on camping trip - itchy and burning.   Review of Systems:    No other skin or systemic complaints except as noted in HPI or Assessment and Plan.  The following portions of the chart were reviewed this encounter and updated as appropriate: medications, allergies, medical history  Relevant Medical History:  n/a   Objective  Well appearing patient in no apparent distress; mood and affect are within normal limits. Examination was performed of the: Bilateral hands Examination notable for: dorsal hands with erythematous plaques, bulla      Examination limited by: Clothing and Patient deferred removal       Assessment & Plan   Pruritic bullous eruption bilateral dorsal hands - favor Allergic Dermatitis  - considered autoimmune bullous, porphyria - less likely given sx/started after being outdoors  - Undiagnosed new problem with uncertain prognosis  - Differential diagnosis, treatment options, prognosis, risk/ benefit, and side effects of treatment were discussed with the patient.  - Start clobetasol ointment 0.05% twice daily to affected skin Discussed side effect of super potent topical steroids including atrophy, dyspigmentation, striae, telangectasia, folliculitis, loss of skin pigment, hair growth, tachyphylaxis, risk of systemic absorption with missuse. Aerobic/Anaerobic swab collected in clinic  Serous fluid drained from blisters on bilateral hands in clinic / advised to leave top on as biologic bandaid  Recheck thurs      Chronic and persistent condition with duration or expected duration over one year. Condition is symptomatic and bothersome to patient. Patient is flaring and not currently at treatment goal.   Procedures, orders, diagnosis for this visit:  RASH AND OTHER NONSPECIFIC SKIN ERUPTION   Related Procedures Anaerobic and Aerobic Culture  Rash and other nonspecific skin eruption -     Anaerobic and Aerobic Culture  Other orders -     Clobetasol Propionate; Apply 1 gram topically to affected area of skin twice daily. Stop once resolved and restart as needed for flares. Avoid use on face, armpits, groin unless otherwise indicated.  Dispense: 60 g; Refill: 5    Return to clinic: Return in about 2 days (around 08/27/2024) for ok to overbook thurs afternoon .  I, Emerick Ege, CMA am acting as scribe for Lauraine JAYSON Kanaris, MD.   Documentation: I have reviewed the above documentation for accuracy and completeness, and I agree with the above.  Lauraine JAYSON Kanaris, MD

## 2024-08-27 ENCOUNTER — Ambulatory Visit

## 2024-08-27 DIAGNOSIS — L239 Allergic contact dermatitis, unspecified cause: Secondary | ICD-10-CM

## 2024-08-27 NOTE — Progress Notes (Signed)
    Subjective   Matthew Sheppard is a 73 y.o. male who presents for the following: Follow up of blistering of the hands. Patient is established patient   Today patient reports: Pt states that blistering of the hands has improved since staring clobetasol 0.05% ointment and Vaseline ointment.  Review of Systems:    No other skin or systemic complaints except as noted in HPI or Assessment and Plan.  The following portions of the chart were reviewed this encounter and updated as appropriate: medications, allergies, medical history  Relevant Medical History:  n/a   Objective  Well appearing patient in no apparent distress; mood and affect are within normal limits. Examination was performed of the: Focused Exam of: the hands   Examination notable for erythematous patches dorsal hands  Examination limited by: n/a     Assessment & Plan   Pruritic bullous eruption bilateral dorsal hands - favor Allergic Dermatitis - improving - considered autoimmune bullous, porphyria - less likely given sx/started after being outdoors  - Continue clobetasol ointment 0.05% twice daily to affected skin Discussed side effect of super potent topical steroids including atrophy, dyspigmentation, striae, telangectasia, folliculitis, loss of skin pigment, hair growth, tachyphylaxis, risk of systemic absorption with missuse. Aerobic/Anaerobic swab collected in clinic - pending  Recheck next week. - discussed po pred - would like to continue topicals    Chronic and persistent condition with duration or expected duration over one year. Condition is symptomatic and bothersome to patient. Patient is flaring and not currently at treatment goal.   Procedures, orders, diagnosis for this visit:    There are no diagnoses linked to this encounter.  Return to clinic: Return in about 1 week (around 09/03/2024) for recheck hands.  Matthew Sheppard, CMA, am acting as scribe for Lauraine JAYSON Kanaris, MD .   Documentation: I  have reviewed the above documentation for accuracy and completeness, and I agree with the above.  Lauraine JAYSON Kanaris, MD

## 2024-08-27 NOTE — Patient Instructions (Signed)

## 2024-08-31 LAB — ANAEROBIC AND AEROBIC CULTURE

## 2024-09-03 ENCOUNTER — Ambulatory Visit

## 2024-09-03 DIAGNOSIS — L239 Allergic contact dermatitis, unspecified cause: Secondary | ICD-10-CM

## 2024-09-03 NOTE — Progress Notes (Signed)
    Subjective   Matthew Sheppard is a 73 y.o. male who presents for the following: Follow up of rash on hands. Patient is established patient   Today patient reports: Patient states rash on hands has improved and lesions are healing well. Patient is still using Clobetasol ointment and Vaseline.   Review of Systems:    No other skin or systemic complaints except as noted in HPI or Assessment and Plan.  The following portions of the chart were reviewed this encounter and updated as appropriate: medications, allergies, medical history  Relevant Medical History:  n/a   Objective  Well appearing patient in no apparent distress; mood and affect are within normal limits. Examination was performed of the: Focused Exam of: bilateral hands   Examination notable for: Erythematous patches - improving from prior. No active bulla.  Examination limited by: Clothing and Patient deferred removal       Assessment & Plan   Allergic Dermatitis of dorsal hands, unclear trigger, resolving today with only post inflammatory erythema  -  sx/started after being outdoors  - Continue clobetasol ointment 0.05% twice daily - advised to just use as needed for itch  - Debridement of excess skin today in clinic - Continue OTC Vaseline  - Discussed results of aerobic/anaerobic bacterial swab with + Ecoli. Suspect contaminant. No evidence of infection today and no need for antibiotic treatment   Chronic and persistent condition with duration or expected duration over one year. Condition is improving with treatment but not currently at goal.   Procedures, orders, diagnosis for this visit:    There are no diagnoses linked to this encounter.  Return to clinic: Return if symptoms worsen or fail to improve.  I, Emerick Ege, CMA am acting as scribe for Lauraine JAYSON Kanaris, MD.   Documentation: I have reviewed the above documentation for accuracy and completeness, and I agree with the above.  Lauraine JAYSON Kanaris, MD

## 2024-09-03 NOTE — Patient Instructions (Signed)

## 2024-09-14 ENCOUNTER — Telehealth: Payer: Self-pay

## 2024-09-14 NOTE — Telephone Encounter (Signed)
 Called patient back regarding message he left on nurse line concerning his rosacea treatment (doxycycline  100 mg ) patient wanted to know if he should continue 100 mg or was it ok to decrease to 50 mg by mouth daily.   Dr. Hester said once you had improved on Doxycycline  100 mg  Once improved, may decrease to once daily, then back to 50 mg daily.  Patient verbalized understanding and denied further questions. Will go back to taking 50 mg of doxycycline  and keep his next followup in December

## 2024-10-06 ENCOUNTER — Encounter: Payer: Self-pay | Admitting: Dermatology

## 2024-10-06 ENCOUNTER — Ambulatory Visit: Admitting: Dermatology

## 2024-10-06 DIAGNOSIS — L821 Other seborrheic keratosis: Secondary | ICD-10-CM | POA: Diagnosis not present

## 2024-10-06 DIAGNOSIS — L719 Rosacea, unspecified: Secondary | ICD-10-CM

## 2024-10-06 DIAGNOSIS — Z79899 Other long term (current) drug therapy: Secondary | ICD-10-CM

## 2024-10-06 DIAGNOSIS — L578 Other skin changes due to chronic exposure to nonionizing radiation: Secondary | ICD-10-CM

## 2024-10-06 DIAGNOSIS — Z1283 Encounter for screening for malignant neoplasm of skin: Secondary | ICD-10-CM | POA: Diagnosis not present

## 2024-10-06 DIAGNOSIS — D1801 Hemangioma of skin and subcutaneous tissue: Secondary | ICD-10-CM

## 2024-10-06 DIAGNOSIS — L570 Actinic keratosis: Secondary | ICD-10-CM | POA: Diagnosis not present

## 2024-10-06 DIAGNOSIS — L700 Acne vulgaris: Secondary | ICD-10-CM

## 2024-10-06 DIAGNOSIS — L814 Other melanin hyperpigmentation: Secondary | ICD-10-CM

## 2024-10-06 DIAGNOSIS — W908XXA Exposure to other nonionizing radiation, initial encounter: Secondary | ICD-10-CM

## 2024-10-06 DIAGNOSIS — D229 Melanocytic nevi, unspecified: Secondary | ICD-10-CM

## 2024-10-06 DIAGNOSIS — Z7189 Other specified counseling: Secondary | ICD-10-CM

## 2024-10-06 MED ORDER — DOXYCYCLINE MONOHYDRATE 50 MG PO TABS: 50.0000 mg | ORAL_TABLET | Freq: Every day | ORAL | 1 refills | Status: AC

## 2024-10-06 NOTE — Patient Instructions (Signed)

## 2024-10-06 NOTE — Progress Notes (Signed)
 Follow-Up Visit   Subjective  Matthew Sheppard is a 73 y.o. male who presents for the following: Skin Cancer Screening and Upper Body Skin Exam hx of Aks, Acen/Rosacea face Doxycycline  50mg  1 po qd  The patient presents for Upper Body Skin Exam (UBSE) for skin cancer screening and mole check. The patient has spots, moles and lesions to be evaluated, some may be new or changing and the patient may have concern these could be cancer.  The following portions of the chart were reviewed this encounter and updated as appropriate: medications, allergies, medical history  Review of Systems:  No other skin or systemic complaints except as noted in HPI or Assessment and Plan.  Objective  Well appearing patient in no apparent distress; mood and affect are within normal limits.  All skin waist up examined. Relevant physical exam findings are noted in the Assessment and Plan.  Scalp x 6 (6) Pink scaly macules  Assessment & Plan   AK (ACTINIC KERATOSIS) (6) Scalp x 6 (6) Actinic keratoses are precancerous spots that appear secondary to cumulative UV radiation exposure/sun exposure over time. They are chronic with expected duration over 1 year. A portion of actinic keratoses will progress to squamous cell carcinoma of the skin. It is not possible to reliably predict which spots will progress to skin cancer and so treatment is recommended to prevent development of skin cancer.  Recommend daily broad spectrum sunscreen SPF 30+ to sun-exposed areas, reapply every 2 hours as needed.  Recommend staying in the shade or wearing long sleeves, sun glasses (UVA+UVB protection) and wide brim hats (4-inch brim around the entire circumference of the hat). Call for new or changing lesions. Destruction of lesion - Scalp x 6 (6) Complexity: simple   Destruction method: cryotherapy   Informed consent: discussed and consent obtained   Timeout:  patient name, date of birth, surgical site, and procedure  verified Lesion destroyed using liquid nitrogen: Yes   Region frozen until ice ball extended beyond lesion: Yes   Outcome: patient tolerated procedure well with no complications   Post-procedure details: wound care instructions given     Skin cancer screening performed today.  Actinic Damage - Chronic condition, secondary to cumulative UV/sun exposure - diffuse scaly erythematous macules with underlying dyspigmentation - Recommend daily broad spectrum sunscreen SPF 30+ to sun-exposed areas, reapply every 2 hours as needed.  - Staying in the shade or wearing long sleeves, sun glasses (UVA+UVB protection) and wide brim hats (4-inch brim around the entire circumference of the hat) are also recommended for sun protection.  - Call for new or changing lesions.  Lentigines, Seborrheic Keratoses, Hemangiomas - Benign normal skin lesions - Benign-appearing - Call for any changes  Melanocytic Nevi - Tan-brown and/or pink-flesh-colored symmetric macules and papules - Benign appearing on exam today - Observation - Call clinic for new or changing moles - Recommend daily use of broad spectrum spf 30+ sunscreen to sun-exposed areas.   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 Currently well-controlled. Treatment Plan: Cont Doxycycline  50mg  1 po qd with food and drink Doxycycline  should be taken with food to prevent nausea. Do not lay down for 30 minutes after taking. Be cautious with sun exposure and use good sun protection while on this medication. Pregnant women should not take this medication.    Return in about 6 months (around 04/06/2025) for TBSE, Hx of AKs.  I, Sonya Hupman, RMA,  am acting as scribe for Alm Rhyme, MD .   Documentation: I have reviewed the above documentation for accuracy and completeness, and I agree with the above.  Alm Rhyme, MD

## 2025-04-13 ENCOUNTER — Encounter: Admitting: Dermatology
